# Patient Record
Sex: Female | Born: 1965 | Race: White | Hispanic: No | Smoking: Never smoker
Health system: Southern US, Community
[De-identification: ages and names within clinical notes are randomized; demographics above are authoritative.]

## PROBLEM LIST (undated history)

## (undated) DIAGNOSIS — I10 Essential (primary) hypertension: Secondary | ICD-10-CM

## (undated) DIAGNOSIS — M5430 Sciatica, unspecified side: Secondary | ICD-10-CM

## (undated) DIAGNOSIS — F329 Major depressive disorder, single episode, unspecified: Secondary | ICD-10-CM

## (undated) DIAGNOSIS — K219 Gastro-esophageal reflux disease without esophagitis: Secondary | ICD-10-CM

## (undated) DIAGNOSIS — D649 Anemia, unspecified: Secondary | ICD-10-CM

## (undated) DIAGNOSIS — E119 Type 2 diabetes mellitus without complications: Secondary | ICD-10-CM

## (undated) DIAGNOSIS — K8000 Calculus of gallbladder with acute cholecystitis without obstruction: Principal | ICD-10-CM

## (undated) DIAGNOSIS — N921 Excessive and frequent menstruation with irregular cycle: Secondary | ICD-10-CM

## (undated) DIAGNOSIS — F419 Anxiety disorder, unspecified: Secondary | ICD-10-CM

## (undated) HISTORY — DX: Excessive and frequent menstruation with irregular cycle: N92.1

## (undated) HISTORY — PX: TONSILLECTOMY: SUR1361

## (undated) HISTORY — PX: TUBAL LIGATION: SHX77

---

## 1998-02-13 ENCOUNTER — Other Ambulatory Visit: Admission: RE | Admit: 1998-02-13 | Discharge: 1998-02-13 | Payer: Self-pay | Admitting: *Deleted

## 1998-03-26 ENCOUNTER — Other Ambulatory Visit: Admission: RE | Admit: 1998-03-26 | Discharge: 1998-03-26 | Payer: Self-pay | Admitting: *Deleted

## 1999-05-01 ENCOUNTER — Other Ambulatory Visit: Admission: RE | Admit: 1999-05-01 | Discharge: 1999-05-01 | Payer: Self-pay | Admitting: *Deleted

## 2000-04-25 ENCOUNTER — Other Ambulatory Visit: Admission: RE | Admit: 2000-04-25 | Discharge: 2000-04-25 | Payer: Self-pay | Admitting: *Deleted

## 2000-11-22 ENCOUNTER — Encounter: Payer: Self-pay | Admitting: Unknown Physician Specialty

## 2000-11-22 ENCOUNTER — Ambulatory Visit (HOSPITAL_COMMUNITY): Admission: RE | Admit: 2000-11-22 | Discharge: 2000-11-22 | Payer: Self-pay | Admitting: Unknown Physician Specialty

## 2005-04-15 ENCOUNTER — Encounter (INDEPENDENT_AMBULATORY_CARE_PROVIDER_SITE_OTHER): Payer: Self-pay | Admitting: Internal Medicine

## 2005-04-15 LAB — CONVERTED CEMR LAB: Pap Smear: NORMAL

## 2005-06-02 ENCOUNTER — Encounter (INDEPENDENT_AMBULATORY_CARE_PROVIDER_SITE_OTHER): Payer: Self-pay | Admitting: *Deleted

## 2005-06-02 ENCOUNTER — Ambulatory Visit: Payer: Self-pay | Admitting: Internal Medicine

## 2005-06-02 LAB — CONVERTED CEMR LAB
BUN: 12 mg/dL
Basophils Absolute: 0.1 10*3/uL
Basophils Relative: 1 %
Creatinine, Ser: 0.8 mg/dL
Eosinophils Absolute: 0.3 10*3/uL
Eosinophils Relative: 3 %
HCT: 43.2 %
Hemoglobin: 14.4 g/dL
Lymphocytes Relative: 26 %
Lymphs Abs: 3 10*3/uL
MCHC: 33.3 g/dL
MCV: 90.2 fL
Monocytes Absolute: 0.7 10*3/uL
Monocytes Relative: 6 %
Neutro Abs: 7.3 10*3/uL
Neutrophils Relative %: 64 %
Platelets: 419 10*3/uL
Potassium: 3.9 meq/L
RBC count: 4.79 10*6/uL
RBC: 4.79 M/uL
RDW: 13.5 %
WBC, blood: 11.3 10*3/uL
WBC: 11.3 10*3/uL

## 2005-08-25 ENCOUNTER — Ambulatory Visit: Payer: Self-pay | Admitting: Internal Medicine

## 2005-10-05 ENCOUNTER — Ambulatory Visit: Payer: Self-pay | Admitting: Internal Medicine

## 2005-11-25 ENCOUNTER — Ambulatory Visit (HOSPITAL_COMMUNITY): Payer: Self-pay | Admitting: Psychiatry

## 2005-12-28 ENCOUNTER — Ambulatory Visit (HOSPITAL_COMMUNITY): Payer: Self-pay | Admitting: Psychiatry

## 2006-01-27 ENCOUNTER — Ambulatory Visit (HOSPITAL_COMMUNITY): Payer: Self-pay | Admitting: Psychiatry

## 2006-04-15 ENCOUNTER — Telehealth (INDEPENDENT_AMBULATORY_CARE_PROVIDER_SITE_OTHER): Payer: Self-pay | Admitting: *Deleted

## 2006-04-22 ENCOUNTER — Encounter: Payer: Self-pay | Admitting: Internal Medicine

## 2006-04-22 DIAGNOSIS — G43909 Migraine, unspecified, not intractable, without status migrainosus: Secondary | ICD-10-CM | POA: Insufficient documentation

## 2006-04-22 DIAGNOSIS — F329 Major depressive disorder, single episode, unspecified: Secondary | ICD-10-CM | POA: Insufficient documentation

## 2006-04-22 DIAGNOSIS — F411 Generalized anxiety disorder: Secondary | ICD-10-CM | POA: Insufficient documentation

## 2006-04-22 DIAGNOSIS — F3289 Other specified depressive episodes: Secondary | ICD-10-CM | POA: Insufficient documentation

## 2006-04-22 DIAGNOSIS — K219 Gastro-esophageal reflux disease without esophagitis: Secondary | ICD-10-CM | POA: Insufficient documentation

## 2006-04-25 ENCOUNTER — Ambulatory Visit: Payer: Self-pay | Admitting: Internal Medicine

## 2006-04-25 DIAGNOSIS — R030 Elevated blood-pressure reading, without diagnosis of hypertension: Secondary | ICD-10-CM | POA: Insufficient documentation

## 2006-04-26 LAB — CONVERTED CEMR LAB
BUN: 11 mg/dL (ref 6–23)
CO2: 22 meq/L (ref 19–32)
Calcium: 8.8 mg/dL (ref 8.4–10.5)
Chloride: 104 meq/L (ref 96–112)
Creatinine, Ser: 0.7 mg/dL (ref 0.40–1.20)
Glucose, Bld: 96 mg/dL (ref 70–99)
Potassium: 3.8 meq/L (ref 3.5–5.3)
Sodium: 139 meq/L (ref 135–145)
TSH: 2.151 microintl units/mL (ref 0.350–5.50)

## 2006-04-28 ENCOUNTER — Ambulatory Visit (HOSPITAL_COMMUNITY): Payer: Self-pay | Admitting: Psychiatry

## 2006-06-02 ENCOUNTER — Ambulatory Visit: Payer: Self-pay | Admitting: Internal Medicine

## 2006-06-02 DIAGNOSIS — E8770 Fluid overload, unspecified: Secondary | ICD-10-CM | POA: Insufficient documentation

## 2006-06-02 LAB — CONVERTED CEMR LAB: Pap Smear: NORMAL

## 2006-06-06 ENCOUNTER — Encounter (INDEPENDENT_AMBULATORY_CARE_PROVIDER_SITE_OTHER): Payer: Self-pay | Admitting: Internal Medicine

## 2006-06-09 ENCOUNTER — Encounter (INDEPENDENT_AMBULATORY_CARE_PROVIDER_SITE_OTHER): Payer: Self-pay | Admitting: Internal Medicine

## 2006-07-05 ENCOUNTER — Ambulatory Visit (HOSPITAL_COMMUNITY): Payer: Self-pay | Admitting: Psychiatry

## 2006-10-06 ENCOUNTER — Ambulatory Visit (HOSPITAL_COMMUNITY): Payer: Self-pay | Admitting: Psychiatry

## 2007-02-08 ENCOUNTER — Telehealth (INDEPENDENT_AMBULATORY_CARE_PROVIDER_SITE_OTHER): Payer: Self-pay | Admitting: Internal Medicine

## 2007-03-09 ENCOUNTER — Telehealth (INDEPENDENT_AMBULATORY_CARE_PROVIDER_SITE_OTHER): Payer: Self-pay | Admitting: *Deleted

## 2007-03-10 ENCOUNTER — Ambulatory Visit: Payer: Self-pay | Admitting: Internal Medicine

## 2007-03-13 ENCOUNTER — Telehealth (INDEPENDENT_AMBULATORY_CARE_PROVIDER_SITE_OTHER): Payer: Self-pay | Admitting: *Deleted

## 2007-03-13 LAB — CONVERTED CEMR LAB
BUN: 11 mg/dL (ref 6–23)
CO2: 23 meq/L (ref 19–32)
Calcium: 9.5 mg/dL (ref 8.4–10.5)
Chloride: 99 meq/L (ref 96–112)
Creatinine, Ser: 0.71 mg/dL (ref 0.40–1.20)
Glucose, Bld: 99 mg/dL (ref 70–99)
Potassium: 3.6 meq/L (ref 3.5–5.3)
Sodium: 139 meq/L (ref 135–145)

## 2007-04-07 ENCOUNTER — Telehealth (INDEPENDENT_AMBULATORY_CARE_PROVIDER_SITE_OTHER): Payer: Self-pay | Admitting: Internal Medicine

## 2007-05-31 ENCOUNTER — Telehealth (INDEPENDENT_AMBULATORY_CARE_PROVIDER_SITE_OTHER): Payer: Self-pay | Admitting: Internal Medicine

## 2007-06-07 ENCOUNTER — Ambulatory Visit: Payer: Self-pay | Admitting: Internal Medicine

## 2007-06-07 DIAGNOSIS — I152 Hypertension secondary to endocrine disorders: Secondary | ICD-10-CM | POA: Insufficient documentation

## 2007-06-07 DIAGNOSIS — I10 Essential (primary) hypertension: Secondary | ICD-10-CM

## 2007-06-07 DIAGNOSIS — E1159 Type 2 diabetes mellitus with other circulatory complications: Secondary | ICD-10-CM | POA: Insufficient documentation

## 2007-06-07 LAB — CONVERTED CEMR LAB
Bilirubin Urine: NEGATIVE
Blood Glucose, Fingerstick: 81
Blood in Urine, dipstick: NEGATIVE
Glucose, Urine, Semiquant: NEGATIVE
Hemoglobin: 14.2 g/dL
Ketones, urine, test strip: NEGATIVE
Nitrite: NEGATIVE
Protein, U semiquant: NEGATIVE
Specific Gravity, Urine: <1.005
Urobilinogen, UA: 0.2
WBC Urine, dipstick: NEGATIVE
pH: 5.5

## 2007-06-22 ENCOUNTER — Other Ambulatory Visit: Admission: RE | Admit: 2007-06-22 | Discharge: 2007-06-22 | Payer: Self-pay | Admitting: Internal Medicine

## 2007-06-22 ENCOUNTER — Encounter (INDEPENDENT_AMBULATORY_CARE_PROVIDER_SITE_OTHER): Payer: Self-pay | Admitting: Internal Medicine

## 2007-06-22 ENCOUNTER — Ambulatory Visit: Payer: Self-pay | Admitting: Internal Medicine

## 2007-06-22 LAB — CONVERTED CEMR LAB: OCCULT 1: NEGATIVE

## 2007-06-28 ENCOUNTER — Telehealth (INDEPENDENT_AMBULATORY_CARE_PROVIDER_SITE_OTHER): Payer: Self-pay | Admitting: *Deleted

## 2007-06-28 ENCOUNTER — Encounter (INDEPENDENT_AMBULATORY_CARE_PROVIDER_SITE_OTHER): Payer: Self-pay | Admitting: Internal Medicine

## 2008-02-28 ENCOUNTER — Telehealth (INDEPENDENT_AMBULATORY_CARE_PROVIDER_SITE_OTHER): Payer: Self-pay | Admitting: *Deleted

## 2008-03-14 ENCOUNTER — Encounter (INDEPENDENT_AMBULATORY_CARE_PROVIDER_SITE_OTHER): Payer: Self-pay | Admitting: Internal Medicine

## 2008-04-17 ENCOUNTER — Ambulatory Visit: Payer: Self-pay | Admitting: Internal Medicine

## 2008-04-30 ENCOUNTER — Encounter (INDEPENDENT_AMBULATORY_CARE_PROVIDER_SITE_OTHER): Payer: Self-pay | Admitting: Internal Medicine

## 2008-05-02 LAB — CONVERTED CEMR LAB
Calcium: 9 mg/dL (ref 8.4–10.5)
Cholesterol: 184 mg/dL (ref 0–200)
Creatinine, Ser: 0.66 mg/dL (ref 0.40–1.20)
HDL: 53 mg/dL (ref >39)
Triglycerides: 174 mg/dL — ABNORMAL HIGH (ref <150)

## 2008-05-14 ENCOUNTER — Ambulatory Visit: Payer: Self-pay | Admitting: Internal Medicine

## 2008-05-14 DIAGNOSIS — J069 Acute upper respiratory infection, unspecified: Secondary | ICD-10-CM | POA: Insufficient documentation

## 2008-06-17 ENCOUNTER — Encounter (INDEPENDENT_AMBULATORY_CARE_PROVIDER_SITE_OTHER): Payer: Self-pay | Admitting: Internal Medicine

## 2008-06-17 ENCOUNTER — Ambulatory Visit: Payer: Self-pay | Admitting: Internal Medicine

## 2008-06-17 ENCOUNTER — Other Ambulatory Visit: Admission: RE | Admit: 2008-06-17 | Discharge: 2008-06-17 | Payer: Self-pay | Admitting: Internal Medicine

## 2008-06-17 DIAGNOSIS — E669 Obesity, unspecified: Secondary | ICD-10-CM | POA: Insufficient documentation

## 2008-07-08 ENCOUNTER — Ambulatory Visit: Payer: Self-pay | Admitting: Internal Medicine

## 2008-07-08 DIAGNOSIS — H6690 Otitis media, unspecified, unspecified ear: Secondary | ICD-10-CM | POA: Insufficient documentation

## 2008-07-08 DIAGNOSIS — L02519 Cutaneous abscess of unspecified hand: Secondary | ICD-10-CM | POA: Insufficient documentation

## 2008-07-08 DIAGNOSIS — L03019 Cellulitis of unspecified finger: Secondary | ICD-10-CM

## 2008-07-09 ENCOUNTER — Encounter (INDEPENDENT_AMBULATORY_CARE_PROVIDER_SITE_OTHER): Payer: Self-pay | Admitting: Internal Medicine

## 2008-07-24 ENCOUNTER — Ambulatory Visit: Payer: Self-pay | Admitting: Internal Medicine

## 2008-07-24 DIAGNOSIS — M545 Low back pain, unspecified: Secondary | ICD-10-CM | POA: Insufficient documentation

## 2008-08-12 ENCOUNTER — Encounter (INDEPENDENT_AMBULATORY_CARE_PROVIDER_SITE_OTHER): Payer: Self-pay | Admitting: Internal Medicine

## 2009-01-13 ENCOUNTER — Emergency Department (HOSPITAL_COMMUNITY): Admission: EM | Admit: 2009-01-13 | Discharge: 2009-01-13 | Payer: Self-pay | Admitting: Emergency Medicine

## 2010-02-01 ENCOUNTER — Emergency Department (HOSPITAL_COMMUNITY)
Admission: EM | Admit: 2010-02-01 | Discharge: 2010-02-01 | Payer: Self-pay | Source: Home / Self Care | Admitting: Emergency Medicine

## 2010-09-17 ENCOUNTER — Emergency Department (HOSPITAL_COMMUNITY)
Admission: EM | Admit: 2010-09-17 | Discharge: 2010-09-17 | Disposition: A | Discharge status: Home / Self Care | Payer: Self-pay | Attending: Emergency Medicine | Admitting: Emergency Medicine

## 2010-09-17 ENCOUNTER — Encounter: Payer: Self-pay | Admitting: *Deleted

## 2010-09-17 DIAGNOSIS — I10 Essential (primary) hypertension: Secondary | ICD-10-CM | POA: Insufficient documentation

## 2010-09-17 DIAGNOSIS — M2559 Pain in other specified joint: Secondary | ICD-10-CM | POA: Insufficient documentation

## 2010-09-17 DIAGNOSIS — M2550 Pain in unspecified joint: Secondary | ICD-10-CM

## 2010-09-17 DIAGNOSIS — G479 Sleep disorder, unspecified: Secondary | ICD-10-CM | POA: Insufficient documentation

## 2010-09-17 DIAGNOSIS — R5383 Other fatigue: Secondary | ICD-10-CM | POA: Insufficient documentation

## 2010-09-17 DIAGNOSIS — R5381 Other malaise: Secondary | ICD-10-CM | POA: Insufficient documentation

## 2010-09-17 DIAGNOSIS — R350 Frequency of micturition: Secondary | ICD-10-CM | POA: Insufficient documentation

## 2010-09-17 HISTORY — DX: Major depressive disorder, single episode, unspecified: F32.9

## 2010-09-17 HISTORY — DX: Essential (primary) hypertension: I10

## 2010-09-17 LAB — POCT PREGNANCY, URINE: Preg Test, Ur: NEGATIVE

## 2010-09-17 LAB — DIFFERENTIAL
Lymphocytes Relative: 28 % (ref 12–46)
Lymphs Abs: 2.2 10*3/uL (ref 0.7–4.0)
Monocytes Absolute: 0.5 10*3/uL (ref 0.1–1.0)
Monocytes Relative: 7 % (ref 3–12)
Neutro Abs: 4.7 10*3/uL (ref 1.7–7.7)

## 2010-09-17 LAB — COMPREHENSIVE METABOLIC PANEL
Alkaline Phosphatase: 73 U/L (ref 39–117)
BUN: 8 mg/dL (ref 6–23)
CO2: 28 mEq/L (ref 19–32)
Chloride: 96 mEq/L (ref 96–112)
Creatinine, Ser: 0.67 mg/dL (ref 0.50–1.10)
GFR calc non Af Amer: >60 mL/min (ref >60)
Total Bilirubin: 0.3 mg/dL (ref 0.3–1.2)

## 2010-09-17 LAB — URINALYSIS, ROUTINE W REFLEX MICROSCOPIC
Hgb urine dipstick: NEGATIVE
Ketones, ur: NEGATIVE mg/dL
Leukocytes, UA: NEGATIVE
Protein, ur: NEGATIVE mg/dL
Urobilinogen, UA: 0.2 mg/dL (ref 0.0–1.0)

## 2010-09-17 LAB — CBC
HCT: 41 % (ref 36.0–46.0)
Hemoglobin: 13.7 g/dL (ref 12.0–15.0)
RBC: 4.72 MIL/uL (ref 3.87–5.11)
WBC: 7.6 10*3/uL (ref 4.0–10.5)

## 2010-09-17 MED ORDER — OXYCODONE-ACETAMINOPHEN 5-325 MG PO TABS
1.0000 | ORAL_TABLET | Freq: Once | ORAL | Status: AC
  Administered 2010-09-17: 1 via ORAL
  Filled 2010-09-17: qty 1

## 2010-09-17 NOTE — ED Notes (Signed)
Pt reports lower back pain for the past week.  Pt denies any injury to her back but states "i just hurt and feel weak all the time".  Pt also reports some rt shoulder pain.  Pt states that she looked in the mirror today and thought that she looked yellow.  nad noted

## 2010-09-17 NOTE — ED Provider Notes (Signed)
History     CSN: [id number] Arrival date & time: 09/17/2010  5:44 PM  Chief Complaint  Patient presents with  . Back Pain   Patient is a 45 y.o. female presenting with back pain. The history is provided by the patient.  Back Pain  The current episode started more than 2 days ago (5 days ago). The problem occurs constantly. The pain is associated with no known injury. The pain is present in the gluteal region and lumbar spine. The quality of the pain is described as aching. The pain does not radiate. The pain is at a severity of 7/10. The pain is moderate. Exacerbated by: nothing. Pertinent negatives include no chest pain, no fever, no numbness, no headaches, no abdominal pain, no tingling and no weakness. Associated symptoms comments: Has noted increased urinary frequency,  But just at night.  Also,  Has had insomnia,  And today thought she looked jaundiced.  She denies abdominal pain.  She has had a lingering,  Spotting type vaginal bleeding since her period began 09/08/10.  Denies pelvic pain or cramping.  She has been fatigued.  She started taking trazodone 100 mg daily 2 days prior to the symptom onset..    Past Medical History  Diagnosis Date  . Hypertension   . Depression     Past Surgical History  Procedure Date  . Tonsillectomy   . Cesarean section     x 2    History reviewed. No pertinent family history.  History  Substance Use Topics  . Smoking status: Never Smoker   . Smokeless tobacco: Not on file  . Alcohol Use: No    OB History    Grav Para Term Preterm Abortions TAB SAB Ect Mult Living                  Review of Systems  Constitutional: Positive for fatigue. Negative for fever and appetite change.  HENT: Negative for congestion, sore throat, facial swelling, rhinorrhea and neck pain.   Eyes: Negative.   Respiratory: Negative for cough, chest tightness and shortness of breath.   Cardiovascular: Negative for chest pain, palpitations and leg swelling.    Gastrointestinal: Negative for nausea, abdominal pain and abdominal distention.  Genitourinary: Positive for urgency.  Musculoskeletal: Positive for back pain. Negative for joint swelling and arthralgias.  Skin: Negative.  Negative for rash and wound.  Neurological: Negative for dizziness, tingling, weakness, light-headedness, numbness and headaches.  Hematological: Negative.   Psychiatric/Behavioral: Positive for sleep disturbance.    Physical Exam  BP 124/75  Pulse 73  Temp(Src) 98.6 F (37 C) (Oral)  Resp 20  Ht 5\' 3"  (1.6 m)  Wt 203 lb (92.08 kg)  BMI 35.96 kg/m2  SpO2 98%  LMP 09/08/2010  Physical Exam  Vitals reviewed. Constitutional: She is oriented to person, place, and time. She appears well-developed and well-nourished. No distress.  HENT:  Head: Normocephalic and atraumatic.  Eyes: Conjunctivae are normal.  Neck: Normal range of motion. Neck supple.  Cardiovascular: Normal rate, regular rhythm, normal heart sounds and intact distal pulses.   Pulmonary/Chest: Effort normal and breath sounds normal. No respiratory distress. She has no wheezes.  Abdominal: Soft. Bowel sounds are normal. She exhibits no mass. There is no tenderness.  Musculoskeletal: Normal range of motion.       Lumbar back: She exhibits tenderness and pain. She exhibits no edema, no deformity and normal pulse.       Paralumbar ttp,  Left with radiation to left buttock  Lymphadenopathy:    She has no cervical adenopathy.  Neurological: She is alert and oriented to person, place, and time. She has normal strength. No sensory deficit. Gait normal.  Reflex Scores:      Patellar reflexes are 2+ on the right side and 2+ on the left side.      Achilles reflexes are 2+ on the right side and 2+ on the left side.      No decreased strength in major muscle groups of lower extremities.  Skin: Skin is warm and dry.  Psychiatric: She has a normal mood and affect.    ED Course  Procedures  MDM    Discussed results of labs tonight.  Discussed possible cause of sx side effect of her new medicine trazodone.      Candis Musa, PA 09/17/10 2034  Addendum to hpi - reports generalized weakness and fatigue.   Candis Musa, PA 09/17/10 2036

## 2010-09-17 NOTE — ED Notes (Signed)
Pt c/o lower back pain x 5 days. Pt states that she is urinating more at night. Denies pain and burning. Also c/o pain in her rt shoulder that has been going on for a while. States that she started her period on 09/08/10 and it is still on. Pt also states that she looks yellow when she looked in the mirror.

## 2010-09-19 NOTE — ED Provider Notes (Signed)
Medical screening examination/treatment/procedure(s) were performed by non-physician practitioner and as supervising physician I was immediately available for consultation/collaboration.  Rohnan Bartleson W Eon Zunker, MD 09/19/10 1613 

## 2011-01-30 ENCOUNTER — Emergency Department (HOSPITAL_COMMUNITY)
Admission: EM | Admit: 2011-01-30 | Discharge: 2011-01-30 | Disposition: A | Discharge status: Home / Self Care | Payer: Self-pay | Attending: Emergency Medicine | Admitting: Emergency Medicine

## 2011-01-30 ENCOUNTER — Encounter (HOSPITAL_COMMUNITY): Payer: Self-pay | Admitting: *Deleted

## 2011-01-30 DIAGNOSIS — I10 Essential (primary) hypertension: Secondary | ICD-10-CM | POA: Insufficient documentation

## 2011-01-30 DIAGNOSIS — M5430 Sciatica, unspecified side: Secondary | ICD-10-CM | POA: Insufficient documentation

## 2011-01-30 DIAGNOSIS — F329 Major depressive disorder, single episode, unspecified: Secondary | ICD-10-CM | POA: Insufficient documentation

## 2011-01-30 DIAGNOSIS — F3289 Other specified depressive episodes: Secondary | ICD-10-CM | POA: Insufficient documentation

## 2011-01-30 HISTORY — DX: Sciatica, unspecified side: M54.30

## 2011-01-30 LAB — URINALYSIS, ROUTINE W REFLEX MICROSCOPIC
Bilirubin Urine: NEGATIVE
Glucose, UA: NEGATIVE mg/dL
Ketones, ur: NEGATIVE mg/dL
Leukocytes, UA: NEGATIVE
Nitrite: NEGATIVE
Protein, ur: NEGATIVE mg/dL
Specific Gravity, Urine: 1.01 (ref 1.005–1.030)
Urobilinogen, UA: 0.2 mg/dL (ref 0.0–1.0)
pH: 6 (ref 5.0–8.0)

## 2011-01-30 LAB — URINE MICROSCOPIC-ADD ON

## 2011-01-30 MED ORDER — KETOROLAC TROMETHAMINE 60 MG/2ML IM SOLN
60.0000 mg | Freq: Once | INTRAMUSCULAR | Status: AC
  Administered 2011-01-30: 60 mg via INTRAMUSCULAR
  Filled 2011-01-30: qty 2

## 2011-01-30 MED ORDER — CYCLOBENZAPRINE HCL 5 MG PO TABS: 10.0000 mg | ORAL_TABLET | Freq: Three times a day (TID) | ORAL | Status: AC | PRN

## 2011-01-30 MED ORDER — CYCLOBENZAPRINE HCL 10 MG PO TABS
10.0000 mg | ORAL_TABLET | Freq: Once | ORAL | Status: AC
  Administered 2011-01-30: 10 mg via ORAL
  Filled 2011-01-30: qty 1

## 2011-01-30 MED ORDER — HYDROCODONE-ACETAMINOPHEN 5-325 MG PO TABS
1.0000 | ORAL_TABLET | Freq: Once | ORAL | Status: AC
  Administered 2011-01-30: 1 via ORAL
  Filled 2011-01-30: qty 1

## 2011-01-30 MED ORDER — IBUPROFEN 600 MG PO TABS: 600.0000 mg | ORAL_TABLET | Freq: Four times a day (QID) | ORAL | Status: AC | PRN

## 2011-01-30 MED ORDER — HYDROCODONE-ACETAMINOPHEN 5-325 MG PO TABS: 1.0000 | ORAL_TABLET | Freq: Once | ORAL | Status: AC

## 2011-01-30 NOTE — ED Notes (Addendum)
Pt c/o lower back pain radiating down both legs x 2 days. Denies urinary symptoms. Hx of sciatica

## 2011-01-30 NOTE — ED Provider Notes (Signed)
History/physical exam/procedure(s) were performed by non-physician practitioner and as supervising physician I was immediately available for consultation/collaboration. I have reviewed all notes and am in agreement with care and plan.   Hilario Quarry, MD 01/30/11 2159

## 2011-01-30 NOTE — ED Provider Notes (Signed)
History     CSN: [id number] Arrival date & time: 01/30/2011  8:10 PM   First MD Initiated Contact with Patient 01/30/11 2022      Chief Complaint  Patient presents with  . Back Pain    (Consider location/radiation/quality/duration/timing/severity/associated sxs/prior treatment) Patient is a 45 y.o. female presenting with back pain. The history is provided by the patient.  Back Pain  This is a recurrent problem. The current episode started 2 days ago. The problem occurs constantly. The problem has not changed since onset.Associated with: Reports old injury from slip on ice several years ago.  MRI at that time revealed degenerative changes in her lower back. The pain is present in the lumbar spine. The quality of the pain is described as stabbing and shooting. The pain radiates to the left knee and right knee. The pain is at a severity of 10/10. The pain is severe. The symptoms are aggravated by certain positions, twisting and bending. The pain is worse during the day. Stiffness is present in the morning. Associated symptoms include leg pain. Pertinent negatives include no chest pain, no fever, no numbness, no headaches, no abdominal pain, no bowel incontinence, no perianal numbness, no bladder incontinence, no dysuria, no paresthesias, no paresis, no tingling and no weakness.    Past Medical History  Diagnosis Date  . Hypertension   . Depression   . Sciatica     Past Surgical History  Procedure Date  . Tonsillectomy   . Cesarean section     x 2    No family history on file.  History  Substance Use Topics  . Smoking status: Never Smoker   . Smokeless tobacco: Not on file  . Alcohol Use: No    OB History    Grav Para Term Preterm Abortions TAB SAB Ect Mult Living                  Review of Systems  Constitutional: Negative for fever.  HENT: Negative for congestion, sore throat and neck pain.   Eyes: Negative.   Respiratory: Negative for chest tightness and shortness  of breath.   Cardiovascular: Negative for chest pain.  Gastrointestinal: Negative for nausea, abdominal pain and bowel incontinence.  Genitourinary: Negative.  Negative for bladder incontinence, dysuria and urgency.  Musculoskeletal: Positive for back pain. Negative for joint swelling and arthralgias.  Skin: Negative.  Negative for rash and wound.  Neurological: Negative for dizziness, tingling, weakness, light-headedness, numbness, headaches and paresthesias.  Hematological: Negative.   Psychiatric/Behavioral: Negative.     Allergies  Bupropion hcl; Squid oil; and St johns wort  Home Medications   Current Outpatient Rx  Name Route Sig Dispense Refill  . FLUOXETINE HCL 40 MG PO CAPS Oral Take 40 mg by mouth daily.      . TRIAMTERENE-HCTZ 37.5-25 MG PO TABS Oral Take 1 tablet by mouth daily.        BP 130/62  Pulse 80  Temp 98.6 F (37 C)  Resp 20  Ht 5\' 3"  (1.6 m)  Wt 212 lb (96.163 kg)  BMI 37.55 kg/m2  SpO2 97%  LMP 01/24/2011  Physical Exam  Nursing note and vitals reviewed. Constitutional: She is oriented to person, place, and time. She appears well-developed and well-nourished.  HENT:  Head: Normocephalic.  Eyes: Conjunctivae are normal.  Neck: Normal range of motion. Neck supple.  Cardiovascular: Regular rhythm and intact distal pulses.        Pedal pulses normal.  Pulmonary/Chest: Effort normal. She  has no wheezes.  Abdominal: Soft. Bowel sounds are normal. She exhibits no distension and no mass.  Musculoskeletal: Normal range of motion. She exhibits no edema.       Lumbar back: She exhibits tenderness. She exhibits no swelling, no edema and no spasm.  Neurological: She is alert and oriented to person, place, and time. She has normal strength. She displays no atrophy and no tremor. No cranial nerve deficit or sensory deficit. Gait normal.  Reflex Scores:      Patellar reflexes are 1+ on the right side and 1+ on the left side.      Achilles reflexes are 2+ on  the right side and 2+ on the left side.      No strength deficit noted in hip and knee flexor and extensor muscle groups.  Ankle flexion and extension intact.  Skin: Skin is warm and dry.  Psychiatric: She has a normal mood and affect.    ED Course  Procedures (including critical care time)  Labs Reviewed  URINALYSIS, ROUTINE W REFLEX MICROSCOPIC - Abnormal; Notable for the following:    Hgb urine dipstick LARGE (*)    All other components within normal limits  URINE MICROSCOPIC-ADD ON - Abnormal; Notable for the following:    Squamous Epithelial / LPF FEW (*)    All other components within normal limits  PREGNANCY, URINE   No results found.  Patient currently on menses,  This was a clean catch specimen.  No sx to suggest kidney stone.   No diagnosis found.    MDM  Chronic intermittent low back pain/sciatica with no significant neuro deficit on exam or by history.   Hydrocodone,  Ibuprofen,  Flexeril.  Referral to ortho prn if sx worsen.        Candis Musa, PA 01/30/11 2139  Candis Musa, PA 01/30/11 [phone]

## 2011-06-01 ENCOUNTER — Encounter (HOSPITAL_COMMUNITY): Payer: Self-pay

## 2011-06-01 ENCOUNTER — Emergency Department (HOSPITAL_COMMUNITY)
Admission: EM | Admit: 2011-06-01 | Discharge: 2011-06-01 | Disposition: A | Discharge status: Home / Self Care | Payer: No Typology Code available for payment source | Attending: Emergency Medicine | Admitting: Emergency Medicine

## 2011-06-01 DIAGNOSIS — F329 Major depressive disorder, single episode, unspecified: Secondary | ICD-10-CM | POA: Insufficient documentation

## 2011-06-01 DIAGNOSIS — M545 Low back pain, unspecified: Secondary | ICD-10-CM | POA: Insufficient documentation

## 2011-06-01 DIAGNOSIS — Y9289 Other specified places as the place of occurrence of the external cause: Secondary | ICD-10-CM | POA: Insufficient documentation

## 2011-06-01 DIAGNOSIS — I10 Essential (primary) hypertension: Secondary | ICD-10-CM | POA: Insufficient documentation

## 2011-06-01 DIAGNOSIS — F3289 Other specified depressive episodes: Secondary | ICD-10-CM | POA: Insufficient documentation

## 2011-06-01 DIAGNOSIS — M5430 Sciatica, unspecified side: Secondary | ICD-10-CM | POA: Insufficient documentation

## 2011-06-01 DIAGNOSIS — R52 Pain, unspecified: Secondary | ICD-10-CM | POA: Insufficient documentation

## 2011-06-01 MED ORDER — CYCLOBENZAPRINE HCL 10 MG PO TABS: 10.0000 mg | ORAL_TABLET | Freq: Three times a day (TID) | ORAL | Status: AC | PRN
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
## 2011-06-01 NOTE — Discharge Instructions (Signed)
° °

## 2011-06-03 NOTE — ED Provider Notes (Signed)
Medical screening examination/treatment/procedure(s) were performed by non-physician practitioner and as supervising physician I was immediately available for consultation/collaboration.   Carleene Cooper III, MD 06/03/11 1210

## 2011-09-24 ENCOUNTER — Encounter (HOSPITAL_COMMUNITY): Payer: Self-pay | Admitting: *Deleted

## 2011-09-24 ENCOUNTER — Emergency Department (HOSPITAL_COMMUNITY)
Admission: EM | Admit: 2011-09-24 | Discharge: 2011-09-24 | Disposition: A | Discharge status: Home / Self Care | Payer: Self-pay | Attending: Emergency Medicine | Admitting: Emergency Medicine

## 2011-09-24 ENCOUNTER — Emergency Department (HOSPITAL_COMMUNITY): Payer: Self-pay

## 2011-09-24 DIAGNOSIS — M545 Low back pain, unspecified: Secondary | ICD-10-CM | POA: Insufficient documentation

## 2011-09-24 DIAGNOSIS — I10 Essential (primary) hypertension: Secondary | ICD-10-CM | POA: Insufficient documentation

## 2011-09-24 DIAGNOSIS — F3289 Other specified depressive episodes: Secondary | ICD-10-CM | POA: Insufficient documentation

## 2011-09-24 DIAGNOSIS — W010XXA Fall on same level from slipping, tripping and stumbling without subsequent striking against object, initial encounter: Secondary | ICD-10-CM | POA: Insufficient documentation

## 2011-09-24 DIAGNOSIS — S20229A Contusion of unspecified back wall of thorax, initial encounter: Secondary | ICD-10-CM | POA: Insufficient documentation

## 2011-09-24 DIAGNOSIS — M79609 Pain in unspecified limb: Secondary | ICD-10-CM | POA: Insufficient documentation

## 2011-09-24 DIAGNOSIS — M1990 Unspecified osteoarthritis, unspecified site: Secondary | ICD-10-CM | POA: Insufficient documentation

## 2011-09-24 DIAGNOSIS — S63509A Unspecified sprain of unspecified wrist, initial encounter: Secondary | ICD-10-CM | POA: Insufficient documentation

## 2011-09-24 DIAGNOSIS — F329 Major depressive disorder, single episode, unspecified: Secondary | ICD-10-CM | POA: Insufficient documentation

## 2011-09-24 DIAGNOSIS — Z79899 Other long term (current) drug therapy: Secondary | ICD-10-CM | POA: Insufficient documentation

## 2011-09-24 MED ORDER — KETOROLAC TROMETHAMINE 10 MG PO TABS
10.0000 mg | ORAL_TABLET | Freq: Once | ORAL | Status: AC
  Administered 2011-09-24: 10 mg via ORAL
  Filled 2011-09-24: qty 1

## 2011-09-24 MED ORDER — METHOCARBAMOL 500 MG PO TABS: ORAL_TABLET | ORAL | Status: DC

## 2011-09-24 MED ORDER — HYDROCODONE-ACETAMINOPHEN 5-325 MG PO TABS
2.0000 | ORAL_TABLET | Freq: Once | ORAL | Status: AC
  Administered 2011-09-24: 2 via ORAL
  Filled 2011-09-24: qty 2

## 2011-09-24 MED ORDER — HYDROCODONE-ACETAMINOPHEN 5-325 MG PO TABS: 1.0000 | ORAL_TABLET | ORAL | Status: AC | PRN

## 2011-09-24 MED ORDER — MELOXICAM 7.5 MG PO TABS: ORAL_TABLET | ORAL | Status: DC

## 2011-09-24 MED ORDER — ONDANSETRON HCL 4 MG PO TABS
4.0000 mg | ORAL_TABLET | Freq: Once | ORAL | Status: AC
  Administered 2011-09-24: 4 mg via ORAL
  Filled 2011-09-24: qty 1

## 2011-09-24 MED ORDER — DIAZEPAM 5 MG PO TABS
5.0000 mg | ORAL_TABLET | Freq: Once | ORAL | Status: AC
  Administered 2011-09-24: 5 mg via ORAL
  Filled 2011-09-24: qty 1

## 2011-09-24 NOTE — ED Notes (Signed)
Fell on wet floor while mopping, pain rt hand and rt back and leg.

## 2011-09-24 NOTE — ED Notes (Signed)
H. Bryant, PA at bedside. 

## 2011-09-24 NOTE — ED Provider Notes (Signed)
History     CSN: [id number]  Arrival date & time 09/24/11  2952   First MD Initiated Contact with Patient 09/24/11 2059      Chief Complaint  Patient presents with  . Fall    (Consider location/radiation/quality/duration/timing/severity/associated sxs/prior treatment) HPI Comments: Patient states she fell on a wet floor while mopping. She complains of pain to the right hand and to the lower back and leg, right more than left. The patient denies being on any blood thinning type medications or having any bleeding disorders. It is of note that she has sciatica, but she has not had any surgical intervention of her back or her arm.  Patient is a 46 y.o. female presenting with fall. The history is provided by the patient.  Fall Pertinent negatives include no abdominal pain and no hematuria.    Past Medical History  Diagnosis Date  . Hypertension   . Depression   . Sciatica     Past Surgical History  Procedure Date  . Tonsillectomy   . Cesarean section     x 2  . Tubal ligation     History reviewed. No pertinent family history.  History  Substance Use Topics  . Smoking status: Never Smoker   . Smokeless tobacco: Not on file  . Alcohol Use: No    OB History    Grav Para Term Preterm Abortions TAB SAB Ect Mult Living                  Review of Systems  Constitutional: Negative for activity change.       All ROS Neg except as noted in HPI  HENT: Negative for nosebleeds and neck pain.   Eyes: Negative for photophobia and discharge.  Respiratory: Negative for cough, shortness of breath and wheezing.   Cardiovascular: Negative for chest pain and palpitations.  Gastrointestinal: Negative for abdominal pain and blood in stool.  Genitourinary: Negative for dysuria, frequency and hematuria.  Musculoskeletal: Positive for back pain. Negative for arthralgias.  Skin: Negative.   Neurological: Negative for dizziness, seizures and speech difficulty.  Psychiatric/Behavioral:  Negative for hallucinations and confusion.       Depression    Allergies  Bupropion hcl; Squid oil; and St johns wort  Home Medications   Current Outpatient Rx  Name Route Sig Dispense Refill  . FLUOXETINE HCL 40 MG PO CAPS Oral Take 40 mg by mouth daily.      . TRIAMTERENE-HCTZ 37.5-25 MG PO TABS Oral Take 1 tablet by mouth daily.      Marland Kitchen HYDROCODONE-ACETAMINOPHEN 5-325 MG PO TABS Oral Take 1 tablet by mouth every 4 (four) hours as needed for pain. 15 tablet 0  . MELOXICAM 7.5 MG PO TABS  1 po bid with food 12 tablet 0  . METHOCARBAMOL 500 MG PO TABS  2 po tid for spasm 30 tablet 0    BP 134/86  Pulse 107  Temp 98.7 F (37.1 C) (Oral)  Resp 20  Ht 5\' 3"  (1.6 m)  Wt 210 lb (95.255 kg)  BMI 37.20 kg/m2  SpO2 97%  LMP 09/18/2011  Physical Exam  Nursing note and vitals reviewed. Constitutional: She is oriented to person, place, and time. She appears well-developed and well-nourished.  Non-toxic appearance.  HENT:  Head: Normocephalic.  Right Ear: Tympanic membrane and external ear normal.  Left Ear: Tympanic membrane and external ear normal.  Eyes: EOM and lids are normal. Pupils are equal, round, and reactive to light.  Neck: Normal  range of motion. Neck supple. Carotid bruit is not present.  Cardiovascular: Normal rate, regular rhythm, normal heart sounds, intact distal pulses and normal pulses.   Pulmonary/Chest: Breath sounds normal. No respiratory distress.  Abdominal: Soft. Bowel sounds are normal. There is no tenderness. There is no guarding.  Musculoskeletal: Normal range of motion.       There is fair range of motion of the fingers of the right hand. There is good capillary refill. There is mild degenerative changes of the right and left hands. There is pain with attempted range of motion of the wrist. There is no palpable deformity of the wrist. The radial pulses are symmetrical. There is full range of motion of the right elbow and right shoulder. There is soreness  with range of motion of the right shoulder.  There is pain to palpation in change of position involving the lower lumbar area. There is full range of motion of both lower extremities. There is good capillary refill of the lower extremities.  Lymphadenopathy:       Head (right side): No submandibular adenopathy present.       Head (left side): No submandibular adenopathy present.    She has no cervical adenopathy.  Neurological: She is alert and oriented to person, place, and time. She has normal strength. No cranial nerve deficit or sensory deficit.  Skin: Skin is warm and dry.  Psychiatric: She has a normal mood and affect. Her speech is normal.    ED Course  Procedures (including critical care time)  Labs Reviewed - No data to display Dg Lumbar Spine Complete  09/24/2011  *RADIOLOGY REPORT*  Clinical Data: Fall, low back pain  LUMBAR SPINE - COMPLETE 4+ VIEW  Comparison: None.  Findings: Five lumbar-type vertebral bodies.  Normal lumbar lordosis.  No evidence of fracture or dislocation.  The body heights are maintained.  Mild multilevel degenerative changes.  Mild superior endplate changes at T9 and T10, likely chronic.  Vascular calcifications.  IMPRESSION: No fracture or dislocation is seen.  Mild multilevel degenerative changes.  Original Report Authenticated By: Charline Bills, M.D.   Dg Hand Complete Right  09/24/2011  *RADIOLOGY REPORT*  Clinical Data: Fall, pain to the distal second through fourth metacarpals  RIGHT HAND - COMPLETE 3+ VIEW  Comparison: None.  Findings: No fracture or dislocation is seen.  Mild degenerative changes at the first MCP joint.  Visualized soft tissues are grossly unremarkable.  IMPRESSION: No fracture or dislocation is seen.  Original Report Authenticated By: Charline Bills, M.D.     1. Wrist sprain   2. Contusion, back   3. DJD (degenerative joint disease)       MDM  The xrays and vital signs have been reviewed. Findings and plan discussed with  the patient. The x-ray of the lumbar spine shows multilevel degenerative changes but no fracture or dislocation. The x-ray of the right hand and wrist show mild degenerative changes particularly at the first MCP joint, but no fracture or dislocation. The patient is treated with Mobic 7.5 mg, Robaxin 500 mg, and Norco 5 mg #15. Patient is to see the orthopedic physician if not improving.       Kathie Dike, [place] 09/24/11 2128

## 2011-09-24 NOTE — ED Notes (Signed)
Pt fell today and now has pain to lower back and to right hand, bruising noted to right upper arm and swelling noted to right hand

## 2011-09-24 NOTE — ED Provider Notes (Signed)
Medical screening examination/treatment/procedure(s) were performed by non-physician practitioner and as supervising physician I was immediately available for consultation/collaboration.  Devoria Albe, MD, Armando Gang   Ward Givens, MD 09/24/11 2129

## 2011-10-08 ENCOUNTER — Emergency Department (HOSPITAL_COMMUNITY)
Admission: EM | Admit: 2011-10-08 | Discharge: 2011-10-08 | Disposition: A | Discharge status: Home / Self Care | Payer: Self-pay | Attending: Emergency Medicine | Admitting: Emergency Medicine

## 2011-10-08 ENCOUNTER — Encounter (HOSPITAL_COMMUNITY): Payer: Self-pay | Admitting: *Deleted

## 2011-10-08 DIAGNOSIS — M79641 Pain in right hand: Secondary | ICD-10-CM

## 2011-10-08 DIAGNOSIS — M79609 Pain in unspecified limb: Secondary | ICD-10-CM | POA: Insufficient documentation

## 2011-10-08 NOTE — ED Notes (Signed)
Rt hand injury at work 8/9 , here for recheck and return to work note.

## 2011-10-08 NOTE — ED Provider Notes (Signed)
History     CSN: [id number]  Arrival date & time 10/08/11  1739   First MD Initiated Contact with Patient 10/08/11 1819      Chief Complaint  Patient presents with  . Hand Pain    (Consider location/radiation/quality/duration/timing/severity/associated sxs/prior treatment) HPI Comments: Pt sustained injury to the right hand on 8/9. She was evaluated and xrays negative. She was advised to see orthopedics but instead attempted to work with the injured hand. When this would not work she notified her supervisor, she was told slhe would need a note to return to work duty. She states her hand is better now, with only occasional soreness and pain. She denies problem with grip or lifting. No numbness or other problems. She request a note for work because she can not afford to see the orthopedic MD at this time.  Patient is a 46 y.o. female presenting with hand pain. The history is provided by the patient.  Hand Pain Pertinent negatives include no abdominal pain, arthralgias, chest pain, coughing or neck pain.    Past Medical History  Diagnosis Date  . Hypertension   . Depression   . Sciatica     Past Surgical History  Procedure Date  . Tonsillectomy   . Cesarean section     x 2  . Tubal ligation     History reviewed. No pertinent family history.  History  Substance Use Topics  . Smoking status: Never Smoker   . Smokeless tobacco: Not on file  . Alcohol Use: No    OB History    Grav Para Term Preterm Abortions TAB SAB Ect Mult Living                  Review of Systems  Constitutional: Negative for activity change.       All ROS Neg except as noted in HPI  HENT: Negative for nosebleeds and neck pain.   Eyes: Negative for photophobia and discharge.  Respiratory: Negative for cough, shortness of breath and wheezing.   Cardiovascular: Negative for chest pain and palpitations.  Gastrointestinal: Negative for abdominal pain and blood in stool.  Genitourinary: Negative for  dysuria, frequency and hematuria.  Musculoskeletal: Positive for back pain. Negative for arthralgias.  Skin: Negative.   Neurological: Negative for dizziness, seizures and speech difficulty.  Psychiatric/Behavioral: Negative for hallucinations and confusion.       Depression    Allergies  Bupropion hcl; Squid oil; and St johns wort  Home Medications   Current Outpatient Rx  Name Route Sig Dispense Refill  . FLUOXETINE HCL 40 MG PO CAPS Oral Take 40 mg by mouth at bedtime.     Marland Kitchen GABAPENTIN 300 MG PO CAPS Oral Take 300 mg by mouth 2 (two) times daily.    Marland Kitchen METHOCARBAMOL 500 MG PO TABS Oral Take 500 mg by mouth daily as needed. for spasm    . TRIAMTERENE-HCTZ 37.5-25 MG PO TABS Oral Take 1 tablet by mouth daily.      . MELOXICAM 7.5 MG PO TABS Oral Take 7.5 mg by mouth 2 (two) times daily.      BP 139/75  Pulse 85  Temp 98.5 F (36.9 C) (Oral)  Resp 18  Ht 5\' 3"  (1.6 m)  Wt 210 lb (95.255 kg)  BMI 37.20 kg/m2  SpO2 98%  LMP 09/18/2011  Physical Exam  Nursing note and vitals reviewed. Constitutional: She is oriented to person, place, and time. She appears well-developed and well-nourished.  Non-toxic appearance.  HENT:  Head: Normocephalic.  Right Ear: Tympanic membrane and external ear normal.  Left Ear: Tympanic membrane and external ear normal.  Eyes: EOM and lids are normal. Pupils are equal, round, and reactive to light.  Neck: Normal range of motion. Neck supple. Carotid bruit is not present.  Cardiovascular: Normal rate, regular rhythm, normal heart sounds, intact distal pulses and normal pulses.   Pulmonary/Chest: Breath sounds normal. No respiratory distress.  Abdominal: Soft. Bowel sounds are normal. There is no tenderness. There is no guarding.  Musculoskeletal:       FROM of the right shoulder and elbow. FROM of the right wrist. No temp changes. Good cap refill. Radial pulses symmetrical. Good ROM of the fingers with mild soreness of the fingers, extending to the  forearm. No abnormality of the thenar areas. Good 2 point discrimination.  Lymphadenopathy:       Head (right side): No submandibular adenopathy present.       Head (left side): No submandibular adenopathy present.    She has no cervical adenopathy.  Neurological: She is alert and oriented to person, place, and time. She has normal strength. No cranial nerve deficit or sensory deficit.  Skin: Skin is warm and dry.  Psychiatric: She has a normal mood and affect. Her speech is normal.    ED Course  Procedures (including critical care time)  Labs Reviewed - No data to display No results found.   No diagnosis found.    MDM  I have reviewed nursing notes, vital signs, and all appropriate lab and imaging results for this patient. Xray of the right hand reviewed from previous visit. Neurovascularly intact. No gross motor deficit. It is safe for patient to return to work. She is encouraged to see the orthopedic MD as suggested for additional evaluation. She agrees with the plan.       Kathie Dike, [place] 10/23/11 1450

## 2011-10-28 NOTE — ED Provider Notes (Signed)
Medical screening examination/treatment/procedure(s) were performed by non-physician practitioner and as supervising physician I was immediately available for consultation/collaboration.   Shelda Jakes, MD 10/28/11 2255

## 2012-03-28 ENCOUNTER — Emergency Department (HOSPITAL_COMMUNITY): Payer: Self-pay

## 2012-03-28 ENCOUNTER — Encounter (HOSPITAL_COMMUNITY): Payer: Self-pay | Admitting: Emergency Medicine

## 2012-03-28 ENCOUNTER — Inpatient Hospital Stay (HOSPITAL_COMMUNITY)
Admission: EM | Admit: 2012-03-28 | Discharge: 2012-03-30 | DRG: 418 | Disposition: A | Discharge status: Home / Self Care | Payer: MEDICAID | Attending: Surgery | Admitting: Surgery

## 2012-03-28 DIAGNOSIS — M5430 Sciatica, unspecified side: Secondary | ICD-10-CM | POA: Diagnosis present

## 2012-03-28 DIAGNOSIS — K819 Cholecystitis, unspecified: Secondary | ICD-10-CM

## 2012-03-28 DIAGNOSIS — F32A Depression, unspecified: Secondary | ICD-10-CM | POA: Diagnosis present

## 2012-03-28 DIAGNOSIS — F3289 Other specified depressive episodes: Secondary | ICD-10-CM | POA: Diagnosis present

## 2012-03-28 DIAGNOSIS — Z9089 Acquired absence of other organs: Secondary | ICD-10-CM

## 2012-03-28 DIAGNOSIS — F329 Major depressive disorder, single episode, unspecified: Secondary | ICD-10-CM | POA: Diagnosis present

## 2012-03-28 DIAGNOSIS — I10 Essential (primary) hypertension: Secondary | ICD-10-CM | POA: Diagnosis present

## 2012-03-28 DIAGNOSIS — Z6841 Body Mass Index (BMI) 40.0 and over, adult: Secondary | ICD-10-CM

## 2012-03-28 DIAGNOSIS — K8000 Calculus of gallbladder with acute cholecystitis without obstruction: Principal | ICD-10-CM | POA: Diagnosis present

## 2012-03-28 DIAGNOSIS — K8010 Calculus of gallbladder with chronic cholecystitis without obstruction: Secondary | ICD-10-CM

## 2012-03-28 DIAGNOSIS — Z888 Allergy status to other drugs, medicaments and biological substances status: Secondary | ICD-10-CM

## 2012-03-28 HISTORY — DX: Calculus of gallbladder with acute cholecystitis without obstruction: K80.00

## 2012-03-28 HISTORY — DX: Anxiety disorder, unspecified: F41.9

## 2012-03-28 HISTORY — DX: Morbid (severe) obesity due to excess calories: E66.01

## 2012-03-28 LAB — URINALYSIS, ROUTINE W REFLEX MICROSCOPIC
Glucose, UA: NEGATIVE mg/dL
Protein, ur: 30 mg/dL — AB
pH: 6.5 (ref 5.0–8.0)

## 2012-03-28 LAB — COMPREHENSIVE METABOLIC PANEL WITH GFR
ALT: 31 U/L (ref 0–35)
AST: 30 U/L (ref 0–37)
Albumin: 3.9 g/dL (ref 3.5–5.2)
Alkaline Phosphatase: 98 U/L (ref 39–117)
BUN: 7 mg/dL (ref 6–23)
CO2: 24 meq/L (ref 19–32)
Calcium: 9.2 mg/dL (ref 8.4–10.5)
Chloride: 98 meq/L (ref 96–112)
Creatinine, Ser: 0.67 mg/dL (ref 0.50–1.10)
GFR calc Af Amer: >90 mL/min
GFR calc non Af Amer: >90 mL/min
Glucose, Bld: 188 mg/dL — ABNORMAL HIGH (ref 70–99)
Potassium: 4.2 meq/L (ref 3.5–5.1)
Sodium: 137 meq/L (ref 135–145)
Total Bilirubin: 0.2 mg/dL — ABNORMAL LOW (ref 0.3–1.2)
Total Protein: 8.2 g/dL (ref 6.0–8.3)

## 2012-03-28 LAB — CBC WITH DIFFERENTIAL/PLATELET
Basophils Absolute: 0.1 K/uL (ref 0.0–0.1)
Basophils Relative: 1 % (ref 0–1)
Eosinophils Absolute: 0.1 K/uL (ref 0.0–0.7)
Eosinophils Relative: 0 % (ref 0–5)
HCT: 42 % (ref 36.0–46.0)
Hemoglobin: 13.7 g/dL (ref 12.0–15.0)
Lymphocytes Relative: 13 % (ref 12–46)
Lymphs Abs: 1.6 K/uL (ref 0.7–4.0)
MCH: 26.9 pg (ref 26.0–34.0)
MCHC: 32.6 g/dL (ref 30.0–36.0)
MCV: 82.4 fL (ref 78.0–100.0)
Monocytes Absolute: 0.4 K/uL (ref 0.1–1.0)
Monocytes Relative: 3 % (ref 3–12)
Neutro Abs: 10.2 K/uL — ABNORMAL HIGH (ref 1.7–7.7)
Neutrophils Relative %: 83 % — ABNORMAL HIGH (ref 43–77)
Platelets: 413 K/uL — ABNORMAL HIGH (ref 150–400)
RBC: 5.1 MIL/uL (ref 3.87–5.11)
RDW: 13.5 % (ref 11.5–15.5)
WBC: 12.3 K/uL — ABNORMAL HIGH (ref 4.0–10.5)

## 2012-03-28 LAB — LIPASE, BLOOD: Lipase: 25 U/L (ref 11–59)

## 2012-03-28 LAB — TROPONIN I: Troponin I: <0.3 ng/mL

## 2012-03-28 LAB — URINE MICROSCOPIC-ADD ON

## 2012-03-28 LAB — PREGNANCY, URINE: Preg Test, Ur: NEGATIVE

## 2012-03-28 MED ORDER — SODIUM CHLORIDE 0.9 % IV SOLN
3.0000 g | Freq: Four times a day (QID) | INTRAVENOUS | Status: DC
  Administered 2012-03-28 – 2012-03-30 (×7): 3 g via INTRAVENOUS
  Filled 2012-03-28 (×10): qty 3

## 2012-03-28 MED ORDER — ENOXAPARIN SODIUM 40 MG/0.4ML ~~LOC~~ SOLN
40.0000 mg | SUBCUTANEOUS | Status: DC
  Administered 2012-03-28 – 2012-03-29 (×2): 40 mg via SUBCUTANEOUS
  Filled 2012-03-28 (×3): qty 0.4

## 2012-03-28 MED ORDER — SODIUM CHLORIDE 0.9 % IV BOLUS (SEPSIS)
1000.0000 mL | Freq: Once | INTRAVENOUS | Status: AC
  Administered 2012-03-28: 1000 mL via INTRAVENOUS

## 2012-03-28 MED ORDER — KCL IN DEXTROSE-NACL 20-5-0.45 MEQ/L-%-% IV SOLN
INTRAVENOUS | Status: DC
  Administered 2012-03-28 – 2012-03-30 (×4): via INTRAVENOUS
  Filled 2012-03-28 (×7): qty 1000

## 2012-03-28 MED ORDER — MORPHINE SULFATE 4 MG/ML IJ SOLN
4.0000 mg | Freq: Once | INTRAMUSCULAR | Status: AC
  Administered 2012-03-28: 4 mg via INTRAVENOUS
  Filled 2012-03-28: qty 1

## 2012-03-28 MED ORDER — MORPHINE SULFATE 2 MG/ML IJ SOLN
1.0000 mg | INTRAMUSCULAR | Status: DC | PRN
  Administered 2012-03-28 – 2012-03-29 (×4): 2 mg via INTRAVENOUS
  Administered 2012-03-29: 4 mg via INTRAVENOUS
  Filled 2012-03-28 (×3): qty 1
  Filled 2012-03-28: qty 2
  Filled 2012-03-28: qty 1

## 2012-03-28 MED ORDER — ONDANSETRON HCL 4 MG/2ML IJ SOLN
4.0000 mg | Freq: Four times a day (QID) | INTRAMUSCULAR | Status: DC | PRN
  Administered 2012-03-28: 4 mg via INTRAVENOUS
  Filled 2012-03-28: qty 2

## 2012-03-28 MED ORDER — TRIAMTERENE-HCTZ 37.5-25 MG PO TABS
1.0000 | ORAL_TABLET | Freq: Every day | ORAL | Status: DC
  Administered 2012-03-30: 1 via ORAL
  Filled 2012-03-28 (×2): qty 1

## 2012-03-28 MED ORDER — ONDANSETRON HCL 4 MG/2ML IJ SOLN
4.0000 mg | Freq: Once | INTRAMUSCULAR | Status: AC
  Administered 2012-03-28: 4 mg via INTRAVENOUS
  Filled 2012-03-28: qty 2

## 2012-03-28 NOTE — ED Notes (Signed)
US at bedside

## 2012-03-28 NOTE — ED Notes (Signed)
States that she has midsternal chest pain with radiation to her back. States that she is relieved with vomiting.

## 2012-03-28 NOTE — H&P (Signed)
Reason for Consult:abdominal pain Referring Physician: Leighanne Adolph Mckee is an 47 y.o. female.  HPI: I was asked to evaluate this patient for epigastric and right upper quadrant abdominal pain which started acutely this morning at about 7:30, about one hour after rising. She said that this started suddenly and has been intense pain which she describes as a "deep" pain located in the epigastric and right upper quadrant regions her abdomen with some associated back pain. She tried to eat some eggs and sausage today to an she vomited this back up. She has vomited about 3 times as well with the last episode being more greenish. She did have a bowel movement today without any improvement. She feels some chills but no fevers. She has had some improvement with the pain medicine given in the emergency room but the pain only lets off for a while and then comes back. She denies any reflux or heartburn symptoms and denies any significant NSAID, steroid, or aspirin use. She denies any prior episodes of discomfort similar to this. She does have a white blood cell count of 12 an ultrasound which demonstrates multiple gallstones but no evidence of cholecystitis.  Past Medical History  Diagnosis Date  . Hypertension   . Depression   . Sciatica     Past Surgical History  Procedure Laterality Date  . Tonsillectomy    . Cesarean section      x 2  . Tubal ligation      History reviewed. No pertinent family history.  Social History:  reports that she has never smoked. She has never used smokeless tobacco. She reports that she does not drink alcohol or use illicit drugs.  Allergies:  Allergies  Allergen Reactions  . Bupropion Hcl Other (See Comments)    Unknown reaction: Pt states that she remembers welts post taking medication  . Squid Oil Swelling  . [place] Wort Rash    Medications: I have reviewed the patient's current medications.  Results for orders placed during the hospital  encounter of 03/28/12 (from the past 48 hour(s))  URINALYSIS, ROUTINE W REFLEX MICROSCOPIC     Status: Abnormal   Collection Time    03/28/12  3:26 PM      Result Value Range   Color, Urine YELLOW  YELLOW   APPearance CLOUDY (*) CLEAR   Specific Gravity, Urine 1.017  1.005 - 1.030   pH 6.5  5.0 - 8.0   Glucose, UA NEGATIVE  NEGATIVE mg/dL   Hgb urine dipstick LARGE (*) NEGATIVE   Bilirubin Urine NEGATIVE  NEGATIVE   Ketones, ur 15 (*) NEGATIVE mg/dL   Protein, ur 30 (*) NEGATIVE mg/dL   Urobilinogen, UA 0.2  0.0 - 1.0 mg/dL   Nitrite NEGATIVE  NEGATIVE   Leukocytes, UA SMALL (*) NEGATIVE  PREGNANCY, URINE     Status: None   Collection Time    03/28/12  3:26 PM      Result Value Range   Preg Test, Ur NEGATIVE  NEGATIVE   Comment:            THE SENSITIVITY OF THIS     METHODOLOGY IS >20 mIU/mL.  URINE MICROSCOPIC-ADD ON     Status: None   Collection Time    03/28/12  3:26 PM      Result Value Range   Squamous Epithelial / LPF RARE  RARE   WBC, UA 3-6  <3 WBC/hpf   RBC / HPF 7-10  <3 RBC/hpf  Bacteria, UA RARE  RARE  CBC WITH DIFFERENTIAL     Status: Abnormal   Collection Time    03/28/12  3:54 PM      Result Value Range   WBC 12.3 (*) 4.0 - 10.5 K/uL   RBC 5.10  3.87 - 5.11 MIL/uL   Hemoglobin 13.7  12.0 - 15.0 g/dL   HCT 40.9  81.1 - 91.4 %   MCV 82.4  78.0 - 100.0 fL   MCH 26.9  26.0 - 34.0 pg   MCHC 32.6  30.0 - 36.0 g/dL   RDW 78.2  95.6 - 21.3 %   Platelets 413 (*) 150 - 400 K/uL   Neutrophils Relative 83 (*) 43 - 77 %   Neutro Abs 10.2 (*) 1.7 - 7.7 K/uL   Lymphocytes Relative 13  12 - 46 %   Lymphs Abs 1.6  0.7 - 4.0 K/uL   Monocytes Relative 3  3 - 12 %   Monocytes Absolute 0.4  0.1 - 1.0 K/uL   Eosinophils Relative 0  0 - 5 %   Eosinophils Absolute 0.1  0.0 - 0.7 K/uL   Basophils Relative 1  0 - 1 %   Basophils Absolute 0.1  0.0 - 0.1 K/uL  COMPREHENSIVE METABOLIC PANEL     Status: Abnormal   Collection Time    03/28/12  3:54 PM      Result Value  Range   Sodium 137  135 - 145 mEq/L   Potassium 4.2  3.5 - 5.1 mEq/L   Chloride 98  96 - 112 mEq/L   CO2 24  19 - 32 mEq/L   Glucose, Bld 188 (*) 70 - 99 mg/dL   BUN 7  6 - 23 mg/dL   Creatinine, Ser 0.86  0.50 - 1.10 mg/dL   Calcium 9.2  8.4 - 57.8 mg/dL   Total Protein 8.2  6.0 - 8.3 g/dL   Albumin 3.9  3.5 - 5.2 g/dL   AST 30  0 - 37 U/L   ALT 31  0 - 35 U/L   Alkaline Phosphatase 98  39 - 117 U/L   Total Bilirubin 0.2 (*) 0.3 - 1.2 mg/dL   GFR calc non Af Amer >90  >90 mL/min   GFR calc Af Amer >90  >90 mL/min   Comment:            The eGFR has been calculated     using the CKD EPI equation.     This calculation has not been     validated in all clinical     situations.     eGFR's persistently     <90 mL/min signify     possible Chronic Kidney Disease.  LIPASE, BLOOD     Status: None   Collection Time    03/28/12  3:54 PM      Result Value Range   Lipase 25  11 - 59 U/L  TROPONIN I     Status: None   Collection Time    03/28/12  3:54 PM      Result Value Range   Troponin I <0.30  <0.30 ng/mL   Comment:            Due to the release kinetics of cTnI,     a negative result within the first hours     of the onset of symptoms does not rule out     myocardial infarction with certainty.     If myocardial  infarction is still suspected,     repeat the test at appropriate intervals.    US Abdomen Complete  03/28/2012  *RADIOLOGY REPORT*  Clinical Data:  Abdominal pain with nausea and vomiting  ABDOMINAL ULTRASOUND COMPLETE  Comparison:  None.  Findings:  Gallbladder:  Multiple gallstones are present.  The largest individual stone visualized measures 1.7 cm in size.  Gallbladder wall thickness remains within normal limits (2.8 mm).  The sonographic Murphy's sign is negative.  Common Bile Duct:  Within normal limits in caliber. Measures 4.9 mm.  Liver: The echogenicity of the liver is increased and heterogeneous.  This suggests fatty infiltration of the liver.  At least two  hypoechoic lesions are noted in the liver, the largest measuring 1.3 x 1.5 x 1.6 cm.  The smaller lesion measures 1.8 x 1.3 x 1.0 cm.  These are not definitively characterized by ultrasound.  IVC:  Appears normal.  Pancreas:  Although the pancreas is difficult to visualize in its entirety, no focal pancreatic abnormality is identified.  Spleen:  Within normal limits in size and echotexture. Measures 7.9 cm in sagittal length.  Right kidney:  Normal in size and parenchymal echogenicity.  No evidence of mass or hydronephrosis.  Left kidney:  Normal in size and parenchymal echogenicity.  No evidence of mass or hydronephrosis.  Abdominal Aorta:  No aneurysm identified. Distal portion of the abdominal aorta is not well visualized due to bowel gas.  IMPRESSION:  1.  Cholelithiasis.  The 2.  Two small hypoechoic lesions in the liver.  No prior abdominal imaging is available for comparison.  Further evaluation with CT of the abdomen with contrast could be considered. 3.  Fatty infiltration of the liver.   Original Report Authenticated By: Britta Mccreedy, M.D.     All other review of systems negative or noncontributory except as stated in the HPI   Blood pressure 147/82, pulse 69, temperature 98.7 F (37.1 C), temperature source Oral, resp. rate 22, last menstrual period 03/17/2012, SpO2 98.00%. General appearance: alert, cooperative and no distress Head: Normocephalic, without obvious abnormality, atraumatic Eyes: negative Neck: no JVD and supple, symmetrical, trachea midline Resp: clear to auscultation bilaterally Cardio: regular rate and rhythm, S1, S2 normal, no murmur, click, rub or gallop GI: soft, upper abdominal tenderness to moderate palpation, greatest in RUQ, ND, no peritoneal signs Extremities: extremities normal, atraumatic, no cyanosis or edema Pulses: 2+ and symmetric Skin: Skin color, texture, turgor normal. No rashes or lesions Neurologic: Grossly normal  Assessment/Plan: Abdominal pain and  cholelithiasis I think that this most likely represents symptomatic cholelithiasis and possible acute cholecystitis, especially given her unrelenting symptoms. She has no evidence of cholecystitis on ultrasound, but she does have persistent focal right lower quadrant tenderness and an elevated white blood cell count. Her LFTs are normal and there is no evidence of any choledocholithiasis. I have recommended cholecystectomy. We will plan for admission with IV fluids and antibiotics and pain control and we will keep her n.p.o. And plan for cholecystectomy. I did discuss with her the procedures and the risks. The risks of infection, bleeding, pain, persistent symptoms, scarring, injury to bowel or bile ducts, retained stone, diarrhea, need for additional procedures, and need for open surgery discussed with the patient. She agreed with the plan of and would like to proceed with cholecystectomy.   Lodema Pilot DAVID 03/28/2012, 6:32 PM

## 2012-03-28 NOTE — ED Notes (Signed)
Pt sts epigastric pain started this morning.  Relived by vomiting.  Pain score 8/10.  Vitals are stable.

## 2012-03-28 NOTE — ED Provider Notes (Signed)
History  This chart was scribed for [name], MD by [name], ED Scribe. The patient was seen in room RESA/RESA. Patient's care was started at 1503.  CSN: [id number]  Arrival date & time 03/28/12  1354   First MD Initiated Contact with Patient 03/28/12 1503      Chief Complaint  Patient presents with  . Abdominal Pain  . Emesis     Patient is a 47 y.o. female presenting with vomiting and abdominal pain. The history is provided by the patient. No language interpreter was used.  Emesis Associated symptoms: abdominal pain   Associated symptoms: no chills and no diarrhea   Abdominal Pain Pain location:  Epigastric and RUQ Pain radiates to:  Does not radiate Pain severity:  Moderate Onset quality:  Sudden Timing:  Constant Progression:  Unchanged Chronicity:  New Relieved by:  Nothing Worsened by:  Vomiting Associated symptoms: nausea and vomiting   Associated symptoms: no chills, no diarrhea and no fever   Vomiting:    Severity:  Mild   Timing:  Sporadic    HPI Comments: Yvonne Mckee is a 47 y.o. female who presents to the Emergency Department complaining of sudden epigastric and RUQ abdominal pain and emesis that began 7.5 hours ago.  Pain is moderate in severity and is constant. Pain is relieved transiently with vomiting.  She denies history of similar pain. She has had 3 episodes of emesis of stomach contents since symptom onset. She states that she had been awake for a few hours before pain began. There is associated nausea and diaphoresis. Patient denies diarrhea, hematemesis, fever or chills. Patient has not tried any medicines for symptom relief. She denies history of gastro problems. She has a medical history of hypertension, depression and sciatica.  Past Medical History  Diagnosis Date  . Hypertension   . Depression   . Sciatica   . Anxiety     Past Surgical History  Procedure Laterality Date  . Tonsillectomy    . Cesarean section      x 2  .  Tubal ligation      Family History  Problem Relation Age of Onset  . Asthma Daughter     History  Substance Use Topics  . Smoking status: Never Smoker   . Smokeless tobacco: Never Used  . Alcohol Use: No    OB History   Grav Para Term Preterm Abortions TAB SAB Ect Mult Living                  Review of Systems  Constitutional: Negative for fever and chills.  Gastrointestinal: Positive for nausea, vomiting and abdominal pain. Negative for diarrhea.  All other systems reviewed and are negative.    Allergies  Bupropion hcl; Squid oil; and St johns wort  Home Medications   No current outpatient prescriptions on file.  Triage Vitals: BP 149/69  Pulse 72  Temp(Src) 98.7 F (37.1 C) (Oral)  Resp 21  SpO2 95%  LMP 03/17/2012  Physical Exam  Constitutional: She appears well-developed and well-nourished. No distress.  HENT:  Head: Normocephalic and atraumatic.  Mouth/Throat: Mucous membranes are dry.  Eyes: Conjunctivae and EOM are normal. Pupils are equal, round, and reactive to light.  Neck: Normal range of motion. Neck supple.  Cardiovascular: Normal rate, regular rhythm and normal heart sounds.   Pulmonary/Chest: Effort normal and breath sounds normal.  Abdominal: Soft. Bowel sounds are normal. She exhibits no distension. There is tenderness in the right upper quadrant and  epigastric area. There is no rebound and no guarding.  Musculoskeletal: Normal range of motion.  Neurological: She is alert.  Skin: Skin is warm and dry. No rash noted.  Psychiatric: She has a normal mood and affect.    ED Course  Procedures (including critical care time) DIAGNOSTIC STUDIES: Oxygen Saturation is 95% on room air, adequate by my interpretation.    COORDINATION OF CARE: 3:24 PM- Patient informed of current plan for treatment and evaluation and agrees with plan at this time.   5:25 PM- Gallstones visualized on [place]. Improved pain after second dose of pain medication. Upon  second exam, epigastric tenderness palpated. Spoke with Dr. Darylene Price who is on call for surgery. He will come to evaluate the patient.  6:40 PM- [name] to admit patient.    Labs Reviewed  CBC WITH DIFFERENTIAL - Abnormal; Notable for the following:    WBC 12.3 (*)    Platelets 413 (*)    Neutrophils Relative 83 (*)    Neutro Abs 10.2 (*)    All other components within normal limits  COMPREHENSIVE METABOLIC PANEL - Abnormal; Notable for the following:    Glucose, Bld 188 (*)    Total Bilirubin 0.2 (*)    All other components within normal limits  URINALYSIS, ROUTINE W REFLEX MICROSCOPIC - Abnormal; Notable for the following:    APPearance CLOUDY (*)    Hgb urine dipstick LARGE (*)    Ketones, ur 15 (*)    Protein, ur 30 (*)    Leukocytes, UA SMALL (*)    All other components within normal limits  MRSA PCR SCREENING  LIPASE, BLOOD  PREGNANCY, URINE  TROPONIN I  URINE MICROSCOPIC-ADD ON  COMPREHENSIVE METABOLIC PANEL  CBC    US Abdomen Complete  03/28/2012  *RADIOLOGY REPORT*  Clinical Data:  Abdominal pain with nausea and vomiting  ABDOMINAL ULTRASOUND COMPLETE  Comparison:  None.  Findings:  Gallbladder:  Multiple gallstones are present.  The largest individual stone visualized measures 1.7 cm in size.  Gallbladder wall thickness remains within normal limits (2.8 mm).  The sonographic Murphy's sign is negative.  Common Bile Duct:  Within normal limits in caliber. Measures 4.9 mm.  Liver: The echogenicity of the liver is increased and heterogeneous.  This suggests fatty infiltration of the liver.  At least two hypoechoic lesions are noted in the liver, the largest measuring 1.3 x 1.5 x 1.6 cm.  The smaller lesion measures 1.8 x 1.3 x 1.0 cm.  These are not definitively characterized by ultrasound.  IVC:  Appears normal.  Pancreas:  Although the pancreas is difficult to visualize in its entirety, no focal pancreatic abnormality is identified.  Spleen:  Within normal limits in size and  echotexture. Measures 7.9 cm in sagittal length.  Right kidney:  Normal in size and parenchymal echogenicity.  No evidence of mass or hydronephrosis.  Left kidney:  Normal in size and parenchymal echogenicity.  No evidence of mass or hydronephrosis.  Abdominal Aorta:  No aneurysm identified. Distal portion of the abdominal aorta is not well visualized due to bowel gas.  IMPRESSION:  1.  Cholelithiasis.  The 2.  Two small hypoechoic lesions in the liver.  No prior abdominal imaging is available for comparison.  Further evaluation with CT of the abdomen with contrast could be considered. 3.  Fatty infiltration of the liver.   Original Report Authenticated By: Britta Mccreedy, M.D.      1. Cholecystitis       MDM  I  personally performed the services described in this documentation, which was scribed in my presence. The recorded information has been reviewed and is accurate.    [name], MD 03/28/12 2351

## 2012-03-29 ENCOUNTER — Inpatient Hospital Stay (HOSPITAL_COMMUNITY): Payer: Self-pay | Admitting: Anesthesiology

## 2012-03-29 ENCOUNTER — Encounter (HOSPITAL_COMMUNITY): Admission: EM | Disposition: A | Discharge status: Home / Self Care | Payer: Self-pay | Source: Home / Self Care

## 2012-03-29 ENCOUNTER — Inpatient Hospital Stay (HOSPITAL_COMMUNITY): Payer: Self-pay

## 2012-03-29 ENCOUNTER — Encounter (HOSPITAL_COMMUNITY): Payer: Self-pay | Admitting: Anesthesiology

## 2012-03-29 ENCOUNTER — Encounter (HOSPITAL_COMMUNITY): Payer: Self-pay

## 2012-03-29 HISTORY — PX: CHOLECYSTECTOMY: SHX55

## 2012-03-29 LAB — CBC
MCH: 26.2 pg (ref 26.0–34.0)
MCV: 82.5 fL (ref 78.0–100.0)
Platelets: 422 10*3/uL — ABNORMAL HIGH (ref 150–400)
RDW: 13.8 % (ref 11.5–15.5)
WBC: 20.7 10*3/uL — ABNORMAL HIGH (ref 4.0–10.5)

## 2012-03-29 LAB — COMPREHENSIVE METABOLIC PANEL
AST: 22 U/L (ref 0–37)
Albumin: 3 g/dL — ABNORMAL LOW (ref 3.5–5.2)
Alkaline Phosphatase: 76 U/L (ref 39–117)
BUN: 7 mg/dL (ref 6–23)
CO2: 28 mEq/L (ref 19–32)
Chloride: 99 mEq/L (ref 96–112)
GFR calc non Af Amer: >90 mL/min (ref >90)
Potassium: 4 mEq/L (ref 3.5–5.1)
Total Bilirubin: 0.2 mg/dL — ABNORMAL LOW (ref 0.3–1.2)

## 2012-03-29 SURGERY — LAPAROSCOPIC CHOLECYSTECTOMY WITH INTRAOPERATIVE CHOLANGIOGRAM
Anesthesia: General | Wound class: Clean Contaminated

## 2012-03-29 MED ORDER — PROPOFOL 10 MG/ML IV BOLUS
INTRAVENOUS | Status: DC | PRN
  Administered 2012-03-29: 50 mg via INTRAVENOUS
  Administered 2012-03-29: 150 mg via INTRAVENOUS

## 2012-03-29 MED ORDER — ACETAMINOPHEN 10 MG/ML IV SOLN: 1000.0000 mg | Freq: Once | INTRAVENOUS | Status: DC | PRN

## 2012-03-29 MED ORDER — EPHEDRINE SULFATE 50 MG/ML IJ SOLN
INTRAMUSCULAR | Status: DC | PRN
  Administered 2012-03-29 (×2): 5 mg via INTRAVENOUS

## 2012-03-29 MED ORDER — MEPERIDINE HCL 50 MG/ML IJ SOLN: 6.2500 mg | INTRAMUSCULAR | Status: DC | PRN

## 2012-03-29 MED ORDER — LACTATED RINGERS IV SOLN
INTRAVENOUS | Status: DC | PRN
  Administered 2012-03-29: 1000 mL via INTRAVENOUS

## 2012-03-29 MED ORDER — HYDROMORPHONE HCL PF 1 MG/ML IJ SOLN
1.0000 mg | INTRAMUSCULAR | Status: DC | PRN
  Administered 2012-03-29 – 2012-03-30 (×6): 1 mg via INTRAVENOUS
  Filled 2012-03-29 (×6): qty 1

## 2012-03-29 MED ORDER — GLYCOPYRROLATE 0.2 MG/ML IJ SOLN
INTRAMUSCULAR | Status: DC | PRN
  Administered 2012-03-29: .2 mg via INTRAVENOUS

## 2012-03-29 MED ORDER — ACETAMINOPHEN 10 MG/ML IV SOLN
INTRAVENOUS | Status: DC | PRN
  Administered 2012-03-29: 1000 mg via INTRAVENOUS

## 2012-03-29 MED ORDER — IOHEXOL 300 MG/ML  SOLN
INTRAMUSCULAR | Status: DC | PRN
  Administered 2012-03-29: 50 mL via INTRAVENOUS

## 2012-03-29 MED ORDER — HYDROMORPHONE HCL PF 1 MG/ML IJ SOLN: 0.2500 mg | INTRAMUSCULAR | Status: DC | PRN

## 2012-03-29 MED ORDER — SUCCINYLCHOLINE CHLORIDE 20 MG/ML IJ SOLN
INTRAMUSCULAR | Status: DC | PRN
  Administered 2012-03-29: 100 mg via INTRAVENOUS

## 2012-03-29 MED ORDER — FENTANYL CITRATE 0.05 MG/ML IJ SOLN
INTRAMUSCULAR | Status: DC | PRN
  Administered 2012-03-29: 100 ug via INTRAVENOUS
  Administered 2012-03-29 (×3): 50 ug via INTRAVENOUS

## 2012-03-29 MED ORDER — ONDANSETRON HCL 4 MG/2ML IJ SOLN
INTRAMUSCULAR | Status: DC | PRN
  Administered 2012-03-29: 4 mg via INTRAVENOUS

## 2012-03-29 MED ORDER — BUPIVACAINE-EPINEPHRINE 0.25% -1:200000 IJ SOLN
INTRAMUSCULAR | Status: DC | PRN
  Administered 2012-03-29: 30 mL

## 2012-03-29 MED ORDER — OXYCODONE-ACETAMINOPHEN 5-325 MG PO TABS: 1.0000 | ORAL_TABLET | ORAL | Status: DC | PRN

## 2012-03-29 MED ORDER — PROMETHAZINE HCL 25 MG/ML IJ SOLN: 6.2500 mg | INTRAMUSCULAR | Status: DC | PRN

## 2012-03-29 MED ORDER — ROCURONIUM BROMIDE 100 MG/10ML IV SOLN
INTRAVENOUS | Status: DC | PRN
  Administered 2012-03-29: 30 mg via INTRAVENOUS
  Administered 2012-03-29: 10 mg via INTRAVENOUS

## 2012-03-29 MED ORDER — NEOSTIGMINE METHYLSULFATE 1 MG/ML IJ SOLN
INTRAMUSCULAR | Status: DC | PRN
  Administered 2012-03-29: 3 mg via INTRAVENOUS

## 2012-03-29 MED ORDER — DEXAMETHASONE SODIUM PHOSPHATE 10 MG/ML IJ SOLN
INTRAMUSCULAR | Status: DC | PRN
  Administered 2012-03-29: 10 mg via INTRAVENOUS

## 2012-03-29 MED ORDER — LIDOCAINE HCL (PF) 2 % IJ SOLN
INTRAMUSCULAR | Status: DC | PRN
  Administered 2012-03-29: 20 mg

## 2012-03-29 MED ORDER — OXYCODONE HCL 5 MG PO TABS: 5.0000 mg | ORAL_TABLET | Freq: Once | ORAL | Status: DC | PRN

## 2012-03-29 MED ORDER — MIDAZOLAM HCL 5 MG/5ML IJ SOLN
INTRAMUSCULAR | Status: DC | PRN
  Administered 2012-03-29: 2 mg via INTRAVENOUS

## 2012-03-29 MED ORDER — OXYCODONE HCL 5 MG/5ML PO SOLN
5.0000 mg | Freq: Once | ORAL | Status: DC | PRN
  Filled 2012-03-29: qty 5

## 2012-03-29 MED ORDER — LACTATED RINGERS IV SOLN
INTRAVENOUS | Status: DC
  Administered 2012-03-29: 1000 mL via INTRAVENOUS

## 2012-03-29 SURGICAL SUPPLY — 40 items
ADH SKN CLS APL DERMABOND .7 (GAUZE/BANDAGES/DRESSINGS) ×2
APPLIER CLIP ROT 10 11.4 M/L (STAPLE) ×4
APR CLP MED LRG 11.4X10 (STAPLE) ×2
BAG SPEC RTRVL LRG 6X4 10 (ENDOMECHANICALS) ×1
CANISTER SUCTION 2500CC (MISCELLANEOUS) ×2 IMPLANT
CHLORAPREP W/TINT 26ML (MISCELLANEOUS) ×2 IMPLANT
CLIP APPLIE ROT 10 11.4 M/L (STAPLE) ×1 IMPLANT
CLOTH BEACON ORANGE TIMEOUT ST (SAFETY) ×2 IMPLANT
COVER MAYO STAND STRL (DRAPES) ×2 IMPLANT
DECANTER SPIKE VIAL GLASS SM (MISCELLANEOUS) ×2 IMPLANT
DERMABOND ADVANCED (GAUZE/BANDAGES/DRESSINGS) ×2
DERMABOND ADVANCED .7 DNX12 (GAUZE/BANDAGES/DRESSINGS) IMPLANT
DRAPE C-ARM 42X72 X-RAY (DRAPES) ×2 IMPLANT
DRAPE LAPAROSCOPIC ABDOMINAL (DRAPES) ×2 IMPLANT
DRAPE UTILITY XL STRL (DRAPES) ×1 IMPLANT
DRAPE WARM FLUID 44X44 (DRAPE) ×2 IMPLANT
ELECT REM PT RETURN 9FT ADLT (ELECTROSURGICAL) ×2
ELECTRODE REM PT RTRN 9FT ADLT (ELECTROSURGICAL) ×1 IMPLANT
FILTER SMOKE EVAC LAPAROSHD (FILTER) ×2 IMPLANT
GLOVE BIOGEL PI IND STRL 7.0 (GLOVE) ×1 IMPLANT
GLOVE BIOGEL PI INDICATOR 7.0 (GLOVE) ×1
GLOVE INDICATOR 8.0 STRL GRN (GLOVE) ×4 IMPLANT
GLOVE SS BIOGEL STRL SZ 8 (GLOVE) ×1 IMPLANT
GLOVE SUPERSENSE BIOGEL SZ 8 (GLOVE) ×1
GOWN STRL NON-REIN LRG LVL3 (GOWN DISPOSABLE) ×1 IMPLANT
GOWN STRL REIN XL XLG (GOWN DISPOSABLE) ×6 IMPLANT
HEMOSTAT SURGICEL 4X8 (HEMOSTASIS) IMPLANT
KIT BASIN OR (CUSTOM PROCEDURE TRAY) ×2 IMPLANT
POUCH SPECIMEN RETRIEVAL 10MM (ENDOMECHANICALS) ×1 IMPLANT
SCISSORS LAP 5X35 DISP (ENDOMECHANICALS) IMPLANT
SET CHOLANGIOGRAPH MIX (MISCELLANEOUS) ×2 IMPLANT
SET IRRIG TUBING LAPAROSCOPIC (IRRIGATION / IRRIGATOR) ×2 IMPLANT
SUT MNCRL AB 4-0 PS2 18 (SUTURE) ×2 IMPLANT
TOWEL OR 17X26 10 PK STRL BLUE (TOWEL DISPOSABLE) ×2 IMPLANT
TOWEL OR NON WOVEN STRL DISP B (DISPOSABLE) ×1 IMPLANT
TRAY LAP CHOLE (CUSTOM PROCEDURE TRAY) ×2 IMPLANT
TROCAR BLADELESS OPT 5 75 (ENDOMECHANICALS) ×4 IMPLANT
TROCAR XCEL BLUNT TIP 100MML (ENDOMECHANICALS) ×2 IMPLANT
TROCAR XCEL NON-BLD 11X100MML (ENDOMECHANICALS) ×2 IMPLANT
TUBING INSUFFLATION 10FT LAP (TUBING) ×2 IMPLANT

## 2012-03-29 NOTE — Transfer of Care (Signed)
Immediate Anesthesia Transfer of Care Note  Patient: Yvonne Mckee  Procedure(s) Performed: Procedure(s): LAPAROSCOPIC CHOLECYSTECTOMY WITH INTRAOPERATIVE CHOLANGIOGRAM (N/A)  Patient Location: PACU  Anesthesia Type:General  Level of Consciousness: awake, oriented and patient cooperative  Airway & Oxygen Therapy: Patient Spontanous Breathing and Patient connected to face mask oxygen  Post-op Assessment: Report given to PACU RN, Post -op Vital signs reviewed and stable and Patient moving all extremities X 4  Post vital signs: Reviewed and stable  Complications: No apparent anesthesia complications

## 2012-03-29 NOTE — Op Note (Signed)
Laparoscopic Cholecystectomy with IOC Procedure Note  Indications: This patient presents with symptomatic gallbladder disease and will undergo laparoscopic cholecystectomy.The procedure has been discussed with the patient. Operative and non operative treatments have been discussed. Risks of surgery include bleeding, infection,  Common bile duct injury,  Injury to the stomach,liver, colon,small intestine, abdominal wall,  Diaphragm,  Major blood vessels,  And the need for an open procedure.  Other risks include worsening of medical problems, death,  DVT and pulmonary embolism, and cardiovascular events.   Medical options have also been discussed. The patient has been informed of long term expectations of surgery and non surgical options,  The patient agrees to proceed.    Pre-operative Diagnosis: Calculus of gallbladder with acute cholecystitis, without mention of obstruction  Post-operative Diagnosis: Same  Surgeon: Mardy Hoppe A.   Assistants: OR staff  Anesthesia: General endotracheal anesthesia and Local anesthesia 0.25.% bupivacaine, with epinephrine  ASA Class: 2  Procedure Details  The patient was seen again in the Holding Room. The risks, benefits, complications, treatment options, and expected outcomes were discussed with the patient. The possibilities of reaction to medication, pulmonary aspiration, perforation of viscus, bleeding, recurrent infection, finding a normal gallbladder, the need for additional procedures, failure to diagnose a condition, the possible need to convert to an open procedure, and creating a complication requiring transfusion or operation were discussed with the patient. The patient and/or family concurred with the proposed plan, giving informed consent. The site of surgery properly noted/marked. The patient was taken to Operating Room, identified as Yvonne Mckee and the procedure verified as Laparoscopic Cholecystectomy with Intraoperative Cholangiograms. A  Time Out was held and the above information confirmed.  Prior to the induction of general anesthesia, antibiotic prophylaxis was administered. General endotracheal anesthesia was then administered and tolerated well. After the induction, the abdomen was prepped in the usual sterile fashion. The patient was positioned in the supine position with the left arm comfortably tucked, along with some reverse Trendelenburg.  Local anesthetic agent was injected into the skin near the umbilicus and an incision made. The midline fascia was incised and the Hasson technique was used to introduce a 12 mm port under direct vision. It was secured with a figure of eight Vicryl suture placed in the usual fashion. Pneumoperitoneum was then created with CO2 and tolerated well without any adverse changes in the patient's vital signs. Additional trocars were introduced under direct vision with an 11 mm trocar in the epigastrium and 2 5 mm trocars in the right upper quadrant. All skin incisions were infiltrated with a local anesthetic agent before making the incision and placing the trocars.   The gallbladder was identified, the fundus grasped and retracted cephalad. Adhesions were lysed bluntly and with the electrocautery where indicated, taking care not to injure any adjacent organs or viscus. The infundibulum was grasped and retracted laterally, exposing the peritoneum overlying the triangle of Calot. This was then divided and exposed in a blunt fashion. The cystic duct was clearly identified and bluntly dissected circumferentially. The junctions of the gallbladder, cystic duct and common bile duct were clearly identified prior to the division of any linear structure.   An incision was made in the cystic duct and the cholangiogram catheter introduced. The catheter was secured using an endoclip. The study showed no stones and good visualization of the distal and proximal biliary tree. The catheter was then removed.   The cystic  duct was then  ligated with surgical clips  on the patient side  and  clipped on the gallbladder side and divided. The cystic artery was identified, dissected free, ligated with clips and divided as well. Posterior cystic artery clipped and divided.  The gallbladder was dissected from the liver bed in retrograde fashion with the electrocautery. The gallbladder was removed. The liver bed was irrigated and inspected. Hemostasis was achieved with the electrocautery. Copious irrigation was utilized and was repeatedly aspirated until clear all particulate matter. Hemostasis was achieved with no signs  Of bleeding or bile leakage.  Pneumoperitoneum was completely reduced after viewing removal of the trocars under direct vision. The wound was thoroughly irrigated and the fascia was then closed with a figure of eight suture; the skin was then closed with 4 0 monocryl  and Dermabond was applied.  Instrument, sponge, and needle counts were correct at closure and at the conclusion of the case.   Findings: Cholecystitis with Cholelithiasis  Estimated Blood Loss: less than 100 mL         Drains: none         Total IV Fluids: 1600 mL         Specimens: Gallbladder           Complications: None; patient tolerated the procedure well.         Disposition: PACU - hemodynamically stable.         Condition: stable

## 2012-03-29 NOTE — Interval H&P Note (Signed)
History and Physical Interval Note:  03/29/2012 12:17 PM  Yvonne Mckee  has presented today for surgery, with the diagnosis of cholelithiasis  The various methods of treatment have been discussed with the patient and family. After consideration of risks, benefits and other options for treatment, the patient has consented to  Procedure(s): LAPAROSCOPIC CHOLECYSTECTOMY WITH INTRAOPERATIVE CHOLANGIOGRAM (N/A) as a surgical intervention .  The patient's history has been reviewed, patient examined, no change in status, stable for surgery.  I have reviewed the patient's chart and labs.  Questions were answered to the patient's satisfaction.     Ahmyah Gidley A.

## 2012-03-29 NOTE — Progress Notes (Signed)
Subjective: Complaining of pain which is constant, even with pain med, in the mid line and RUQ.  No complaints of nausea.  Objective: Vital signs in last 24 hours: Temp:  [97.8 F (36.6 C)-98.7 F (37.1 C)] 98.1 F (36.7 C) (02/12 0625) Pulse Rate:  [68-81] 81 (02/12 0625) Resp:  [18-22] 18 (02/12 0625) BP: (125-161)/(69-85) 144/85 mmHg (02/12 0625) SpO2:  [95 %-99 %] 95 % (02/12 0625) Weight:  [231 lb 0.7 oz (104.8 kg)] 231 lb 0.7 oz (104.8 kg) (02/11 2026) Last BM Date: 03/28/12  NPO, VSS, LFT's are normal, WBC is up  Intake/Output from previous day: 02/11 0701 - 02/12 0700 In: 1050 [I.V.:1050] Out: -  Intake/Output this shift:    General appearance: alert, cooperative and mild discomfort Abd: soft, very tender with ongoing pain midline and RUQ.  Lab Results:   Recent Labs  03/28/12 1554 03/29/12 0455  WBC 12.3* 20.7*  HGB 13.7 12.0  HCT 42.0 37.8  PLT 413* 422*    BMET  Recent Labs  03/28/12 1554 03/29/12 0455  NA 137 135  K 4.2 4.0  CL 98 99  CO2 24 28  GLUCOSE 188* 183*  BUN 7 7  CREATININE 0.67 0.64  CALCIUM 9.2 8.1*   PT/INR No results found for this basename: LABPROT, INR,  in the last 72 hours   Recent Labs Lab 03/28/12 1554 03/29/12 0455  AST 30 22  ALT 31 27  ALKPHOS 98 76  BILITOT 0.2* 0.2*  PROT 8.2 6.5  ALBUMIN 3.9 3.0*     Lipase     Component Value Date/Time   LIPASE 25 03/28/2012 1554     Studies/Results: US Abdomen Complete  03/28/2012  *RADIOLOGY REPORT*  Clinical Data:  Abdominal pain with nausea and vomiting  ABDOMINAL ULTRASOUND COMPLETE  Comparison:  None.  Findings:  Gallbladder:  Multiple gallstones are present.  The largest individual stone visualized measures 1.7 cm in size.  Gallbladder wall thickness remains within normal limits (2.8 mm).  The sonographic Murphy's sign is negative.  Common Bile Duct:  Within normal limits in caliber. Measures 4.9 mm.  Liver: The echogenicity of the liver is increased and  heterogeneous.  This suggests fatty infiltration of the liver.  At least two hypoechoic lesions are noted in the liver, the largest measuring 1.3 x 1.5 x 1.6 cm.  The smaller lesion measures 1.8 x 1.3 x 1.0 cm.  These are not definitively characterized by ultrasound.  IVC:  Appears normal.  Pancreas:  Although the pancreas is difficult to visualize in its entirety, no focal pancreatic abnormality is identified.  Spleen:  Within normal limits in size and echotexture. Measures 7.9 cm in sagittal length.  Right kidney:  Normal in size and parenchymal echogenicity.  No evidence of mass or hydronephrosis.  Left kidney:  Normal in size and parenchymal echogenicity.  No evidence of mass or hydronephrosis.  Abdominal Aorta:  No aneurysm identified. Distal portion of the abdominal aorta is not well visualized due to bowel gas.  IMPRESSION:  1.  Cholelithiasis.  The 2.  Two small hypoechoic lesions in the liver.  No prior abdominal imaging is available for comparison.  Further evaluation with CT of the abdomen with contrast could be considered. 3.  Fatty infiltration of the liver.   Original Report Authenticated By: Britta Mccreedy, M.D.     Medications: . ampicillin-sulbactam (UNASYN) IV  3 g Intravenous Q6H  . enoxaparin (LOVENOX) injection  40 mg Subcutaneous Q24H  . triamterene-hydrochlorothiazide  1  each Oral Daily    Assessment/Plan Abdominal pain and cholelithiasis Hypertension Depression  Sciatica  Body mass index is 40.9   Plan:  She is NPO and on Unasyn, plan OR later today.     LOS: 1 day    Yvonne Mckee 03/29/2012

## 2012-03-29 NOTE — Progress Notes (Signed)
ACUTE CHOLECYSTITIS.  LAP CHOLE TODAY. The procedure has been discussed with the patient. Operative and non operative treatments have been discussed. Risks of surgery include bleeding, infection,  Common bile duct injury,  Injury to the stomach,liver, colon,small intestine, abdominal wall,  Diaphragm,  Major blood vessels,  And the need for an open procedure.  Other risks include worsening of medical problems, death,  DVT and pulmonary embolism, and cardiovascular events.   Medical options have also been discussed. The patient has been informed of long term expectations of surgery and non surgical options,  The patient agrees to proceed.

## 2012-03-29 NOTE — Anesthesia Preprocedure Evaluation (Addendum)
Anesthesia Evaluation  Patient identified by MRN, date of birth, ID band Patient awake    Reviewed: Allergy & Precautions, H&P , NPO status , Patient's Chart, lab work & pertinent test results  Airway Mallampati: II TM Distance: >3 FB Neck ROM: Full    Dental  (+) Dental Advisory Given and Teeth Intact   Pulmonary neg pulmonary ROS,  breath sounds clear to auscultation  Pulmonary exam normal       Cardiovascular hypertension, Pt. on medications Rhythm:Regular Rate:Normal     Neuro/Psych negative neurological ROS  negative psych ROS   GI/Hepatic Neg liver ROS, GERD-  ,  Endo/Other  Morbid obesity  Renal/GU negative Renal ROS  negative genitourinary   Musculoskeletal negative musculoskeletal ROS (+)   Abdominal (+) + obese,   Peds negative pediatric ROS (+)  Hematology negative hematology ROS (+)   Anesthesia Other Findings   Reproductive/Obstetrics negative OB ROS                          Anesthesia Physical Anesthesia Plan  ASA: III  Anesthesia Plan: General   Post-op Pain Management:    Induction: Intravenous  Airway Management Planned: Oral ETT  Additional Equipment:   Intra-op Plan:   Post-operative Plan: Extubation in OR  Informed Consent: I have reviewed the patients History and Physical, chart, labs and discussed the procedure including the risks, benefits and alternatives for the proposed anesthesia with the patient or authorized representative who has indicated his/her understanding and acceptance.   Dental advisory given  Plan Discussed with: CRNA  Anesthesia Plan Comments:         Anesthesia Quick Evaluation

## 2012-03-29 NOTE — Preoperative (Signed)
Beta Blockers   Reason not to administer Beta Blockers:Not Applicable 

## 2012-03-30 ENCOUNTER — Encounter (HOSPITAL_COMMUNITY): Payer: Self-pay | Admitting: General Surgery

## 2012-03-30 DIAGNOSIS — K8000 Calculus of gallbladder with acute cholecystitis without obstruction: Principal | ICD-10-CM

## 2012-03-30 DIAGNOSIS — F32A Depression, unspecified: Secondary | ICD-10-CM

## 2012-03-30 DIAGNOSIS — F329 Major depressive disorder, single episode, unspecified: Secondary | ICD-10-CM | POA: Diagnosis present

## 2012-03-30 DIAGNOSIS — M5430 Sciatica, unspecified side: Secondary | ICD-10-CM

## 2012-03-30 HISTORY — DX: Morbid (severe) obesity due to excess calories: E66.01

## 2012-03-30 HISTORY — DX: Depression, unspecified: F32.A

## 2012-03-30 HISTORY — DX: Calculus of gallbladder with acute cholecystitis without obstruction: K80.00

## 2012-03-30 HISTORY — DX: Sciatica, unspecified side: M54.30

## 2012-03-30 MED ORDER — ACETAMINOPHEN 325 MG PO TABS: ORAL_TABLET | ORAL | Status: DC

## 2012-03-30 MED ORDER — OXYCODONE-ACETAMINOPHEN 5-325 MG PO TABS: 1.0000 | ORAL_TABLET | ORAL | Status: DC | PRN

## 2012-03-30 NOTE — Progress Notes (Signed)
Patient discharged to home with family via wheelchair, discharge instructions reviewed with patient who verbalized understanding, new RX given to patient.

## 2012-03-30 NOTE — Progress Notes (Signed)
1 Day Post-Op  Subjective: Feeling better, taking clears, has been up some.  Objective: Vital signs in last 24 hours: Temp:  [97.9 F (36.6 C)-98.8 F (37.1 C)] 98.2 F (36.8 C) (02/13 0525) Pulse Rate:  [68-99] 84 (02/13 0525) Resp:  [16-20] 18 (02/13 0525) BP: (98-126)/(41-76) 114/67 mmHg (02/13 0525) SpO2:  [90 %-97 %] 97 % (02/13 0525) Last BM Date: 03/28/12  Diet: clears, afebrile, VSS, no labs  Intake/Output from previous day: 02/12 0701 - 02/13 0700 In: 3127.1 [I.V.:2927.1; IV Piggyback:200] Out: 1350 [Urine:1350] Intake/Output this shift:    General appearance: alert, cooperative and no distress GI: soft, tender post op, few bS, some flatus  Lab Results:   Recent Labs  03/28/12 1554 03/29/12 0455  WBC 12.3* 20.7*  HGB 13.7 12.0  HCT 42.0 37.8  PLT 413* 422*    BMET  Recent Labs  03/28/12 1554 03/29/12 0455  NA 137 135  K 4.2 4.0  CL 98 99  CO2 24 28  GLUCOSE 188* 183*  BUN 7 7  CREATININE 0.67 0.64  CALCIUM 9.2 8.1*   PT/INR No results found for this basename: LABPROT, INR,  in the last 72 hours   Recent Labs Lab 03/28/12 1554 03/29/12 0455  AST 30 22  ALT 31 27  ALKPHOS 98 76  BILITOT 0.2* 0.2*  PROT 8.2 6.5  ALBUMIN 3.9 3.0*     Lipase     Component Value Date/Time   LIPASE 25 03/28/2012 1554     Studies/Results: Dg Cholangiogram Operative  03/29/2012  *RADIOLOGY REPORT*  Clinical Data:   Cholelithiasis  INTRAOPERATIVE CHOLANGIOGRAM  Technique:  Cholangiographic images from the C-arm fluoroscopic device were submitted for interpretation post-operatively.  Please see the procedural report for the amount of contrast and the fluoroscopy time utilized.  Comparison:  None  Findings:  No persistent filling defects in the common duct. Intrahepatic ducts are incompletely visualized, appearing decompressed centrally. 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   IMPRESSION  Negative for retained common duct stone.   Original Report Authenticated By: D. Andria Rhein, MD    US Abdomen Complete  03/28/2012  *RADIOLOGY REPORT*  Clinical Data:  Abdominal pain with nausea and vomiting  ABDOMINAL ULTRASOUND COMPLETE  Comparison:  None.  Findings:  Gallbladder:  Multiple gallstones are present.  The largest individual stone visualized measures 1.7 cm in size.  Gallbladder wall thickness remains within normal limits (2.8 mm).  The sonographic Murphy's sign is negative.  Common Bile Duct:  Within normal limits in caliber. Measures 4.9 mm.  Liver: The echogenicity of the liver is increased and heterogeneous.  This suggests fatty infiltration of the liver.  At least two hypoechoic lesions are noted in the liver, the largest measuring 1.3 x 1.5 x 1.6 cm.  The smaller lesion measures 1.8 x 1.3 x 1.0 cm.  These are not definitively characterized by ultrasound.  IVC:  Appears normal.  Pancreas:  Although the pancreas is difficult to visualize in its entirety, no focal pancreatic abnormality is identified.  Spleen:  Within normal limits in size and echotexture. Measures 7.9 cm in sagittal length.  Right kidney:  Normal in size and parenchymal echogenicity.  No evidence of mass or hydronephrosis.  Left kidney:  Normal in size and parenchymal echogenicity.  No evidence of mass or hydronephrosis.  Abdominal Aorta:  No aneurysm identified. Distal portion of the abdominal aorta is not well visualized due to bowel  gas.  IMPRESSION:  1.  Cholelithiasis.  The 2.  Two small hypoechoic lesions in the liver.  No prior abdominal imaging is available for comparison.  Further evaluation with CT of the abdomen with contrast could be considered. 3.  Fatty infiltration of the liver.   Original Report Authenticated By: Britta Mccreedy, M.D.     Medications: . ampicillin-sulbactam (UNASYN) IV  3 g Intravenous Q6H  . enoxaparin (LOVENOX) injection  40 mg Subcutaneous Q24H  .  triamterene-hydrochlorothiazide  1 each Oral Daily    Assessment/Plan Calculus of gallbladder with acute cholecystitis, without mention of obstruction, s/p Laparoscopic Cholecystectomy with IOC, 03/29/2012, Thomas A. Cornett, MD. Hypertension  Depression  Sciatica  Body mass index is 40.9   Plan:  Advance diet as tolerated.  Home when she can tolerate regular diet and ambulate without difficulty.     LOS: 2 days    Yvonne Mckee 03/30/2012

## 2012-03-30 NOTE — Discharge Summary (Signed)
Looks well Home today

## 2012-03-30 NOTE — Progress Notes (Signed)
Maybe home later

## 2012-03-30 NOTE — Discharge Summary (Signed)
Physician Discharge Summary  Patient ID: Yvonne Mckee MRN: [id number] DOB/AGE: 47/16/1967 47 y.o.  Admit date: 03/28/2012 Discharge date: 03/30/2012  Admission Diagnoses: Cholelithiasis with cholecystitis Hypertension  Depression  Sciatica  Body mass index is 40.9  Discharge Diagnoses:  Principal Problem:   Calculus of gallbladder with acute cholecystitis, without mention of obstruction Active Problems:   Depression   Sciatica   Severe obesity (BMI >= 40)   PROCEDURES: Laparoscopic Cholecystectomy with IOC , 03/29/2012, Thomas A. Cornett, MD     Hospital Course: was asked to evaluate this patient for epigastric and right upper quadrant abdominal pain which started acutely this morning at about 7:30, about one hour after rising. She said that this started suddenly and has been intense pain which she describes as a "deep" pain located in the epigastric and right upper quadrant regions her abdomen with some associated back pain. She tried to eat some eggs and sausage today to an she vomited this back up. She has vomited about 3 times as well with the last episode being more greenish. She did have a bowel movement today without any improvement. She feels some chills but no fevers. She has had some improvement with the pain medicine given in the emergency room but the pain only lets off for a while and then comes back. She denies any reflux or heartburn symptoms and denies any significant NSAID, steroid, or aspirin use. She denies any prior episodes of discomfort similar to this. She does have a white blood cell count of 12 an ultrasound which demonstrates multiple gallstones but no evidence of cholecystitis.  She was admitted by Dr. Biagio Quint and the following AM her WBC was up more. She still had significant discomfort.  She was taken to the OR that day by Dr. Luisa Hart.  And underwent above noted procedure.  She has done well post op and is tolerating a regular diet.  Walking and has had a  BM.  She is ready to go home .  She will follow up with DR. Cornett or the [facility] in 2 weeks.  Condition on D/C:  Improved.    Disposition: 01-Home or Self Care     Medication List    TAKE these medications       acetaminophen 325 MG tablet  Commonly known as:  TYLENOL  Do not take more than 4000 mg of tylenol (actaminophen) in 24 hours.     cyclobenzaprine 10 MG tablet  Commonly known as:  FLEXERIL  Take 10 mg by mouth 3 (three) times daily as needed for muscle spasms.     FLUoxetine 40 MG capsule  Commonly known as:  PROZAC  Take 40 mg by mouth at bedtime.     oxyCODONE-acetaminophen 5-325 MG per tablet  Commonly known as:  PERCOCET/ROXICET  Take 1-2 tablets by mouth every 4 (four) hours as needed.     triamterene-hydrochlorothiazide 37.5-25 MG per tablet  Commonly known as:  MAXZIDE-25  Take 1 tablet by mouth daily.       Follow-up Information   Follow up with CORNETT,THOMAS A., MD. Schedule an appointment as soon as possible for a visit in 2 weeks. (Make an appointment with the [facility] or Dr. Luisa Hart 2-3 weeks.)    Contact information:   [address] [place] [address] [phone]       Call METZ,CHRISTINE. (For any medical issues and let her know you had surgery.)    Contact information:   [address] [place] [address] [phone]       Signed: Sherrie George 03/30/2012, 1:58 PM

## 2012-03-31 ENCOUNTER — Encounter (HOSPITAL_COMMUNITY): Payer: Self-pay | Admitting: Surgery

## 2012-04-14 ENCOUNTER — Encounter (INDEPENDENT_AMBULATORY_CARE_PROVIDER_SITE_OTHER): Payer: Self-pay | Admitting: Surgery

## 2012-04-14 ENCOUNTER — Ambulatory Visit (INDEPENDENT_AMBULATORY_CARE_PROVIDER_SITE_OTHER): Payer: Self-pay | Admitting: Surgery

## 2012-04-14 VITALS — BP 146/84 | HR 114 | Temp 97.4°F | Resp 16 | Ht 63.0 in | Wt 212.4 lb

## 2012-04-14 DIAGNOSIS — Z9889 Other specified postprocedural states: Secondary | ICD-10-CM

## 2012-04-14 NOTE — Progress Notes (Signed)
she is here for a postop visit following laparoscopic cholecystectomy.  Diet is being tolerated, bowels are moving.  No problems with incisions. Some heartburn she thinks is from prozac.  PE:  ABD:  Soft, incisions clean/dry/intact and solid.  Assessment:  Doing well postop.  Plan:  Lowfat diet recommended.  Activities as tolerated.  Return visit prn. Cut back caffiene intake and fat intake and change antidepressant to see if this helps.  May need GI work up for GERD.

## 2012-04-14 NOTE — Patient Instructions (Signed)
Try to change antidepressant med for reflux. If it does not help,  Cut back on fat intake and caffiene intake.

## 2012-04-24 ENCOUNTER — Other Ambulatory Visit (HOSPITAL_COMMUNITY): Payer: Self-pay | Admitting: *Deleted

## 2012-04-24 DIAGNOSIS — Z139 Encounter for screening, unspecified: Secondary | ICD-10-CM

## 2012-04-27 ENCOUNTER — Ambulatory Visit (HOSPITAL_COMMUNITY)
Admission: RE | Admit: 2012-04-27 | Discharge: 2012-04-27 | Disposition: A | Discharge status: Home / Self Care | Payer: Self-pay | Source: Ambulatory Visit | Attending: *Deleted | Admitting: *Deleted

## 2012-04-27 DIAGNOSIS — Z139 Encounter for screening, unspecified: Secondary | ICD-10-CM

## 2012-05-01 ENCOUNTER — Other Ambulatory Visit: Payer: Self-pay | Admitting: *Deleted

## 2012-05-01 DIAGNOSIS — R928 Other abnormal and inconclusive findings on diagnostic imaging of breast: Secondary | ICD-10-CM

## 2012-05-24 ENCOUNTER — Encounter (HOSPITAL_COMMUNITY): Payer: Self-pay

## 2012-05-24 ENCOUNTER — Ambulatory Visit (HOSPITAL_COMMUNITY)
Admission: RE | Admit: 2012-05-24 | Discharge: 2012-05-24 | Disposition: A | Discharge status: Home / Self Care | Payer: Self-pay | Source: Ambulatory Visit | Attending: *Deleted | Admitting: *Deleted

## 2012-05-24 DIAGNOSIS — R928 Other abnormal and inconclusive findings on diagnostic imaging of breast: Secondary | ICD-10-CM | POA: Insufficient documentation

## 2013-06-12 ENCOUNTER — Other Ambulatory Visit (HOSPITAL_COMMUNITY): Payer: Self-pay | Admitting: *Deleted

## 2013-08-27 IMAGING — CR DG LUMBAR SPINE COMPLETE 4+V
5 series · 5 of 5 positions shown · non-contrast
Comparison: None.

CLINICAL DATA: Fall, low back pain

LUMBAR SPINE - COMPLETE 4+ VIEW

[view not recorded (1 of 5)]
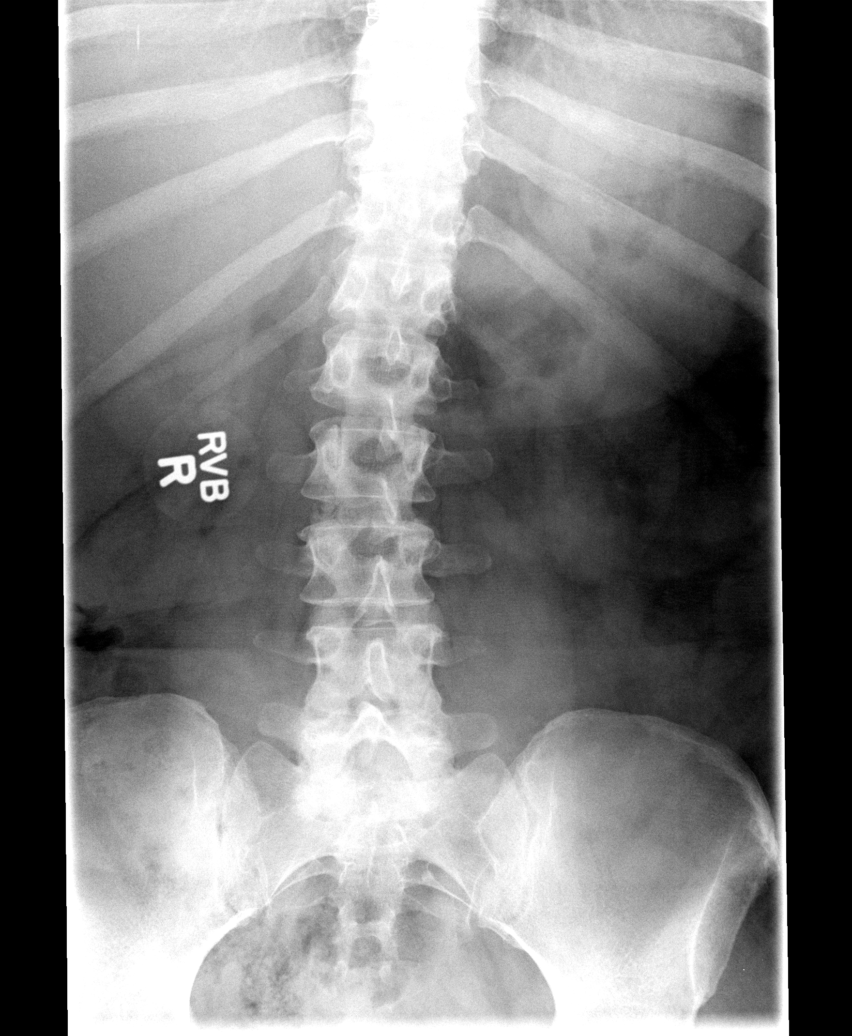

[view not recorded (2 of 5)]
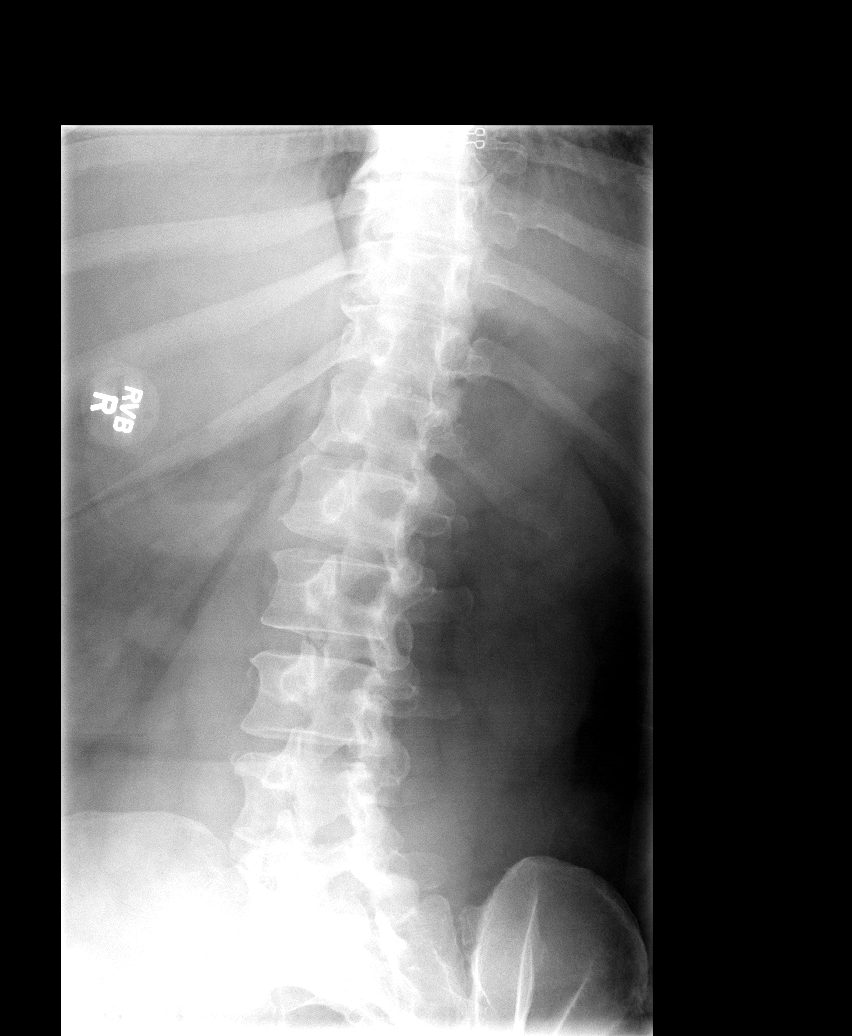

[view not recorded (3 of 5)]
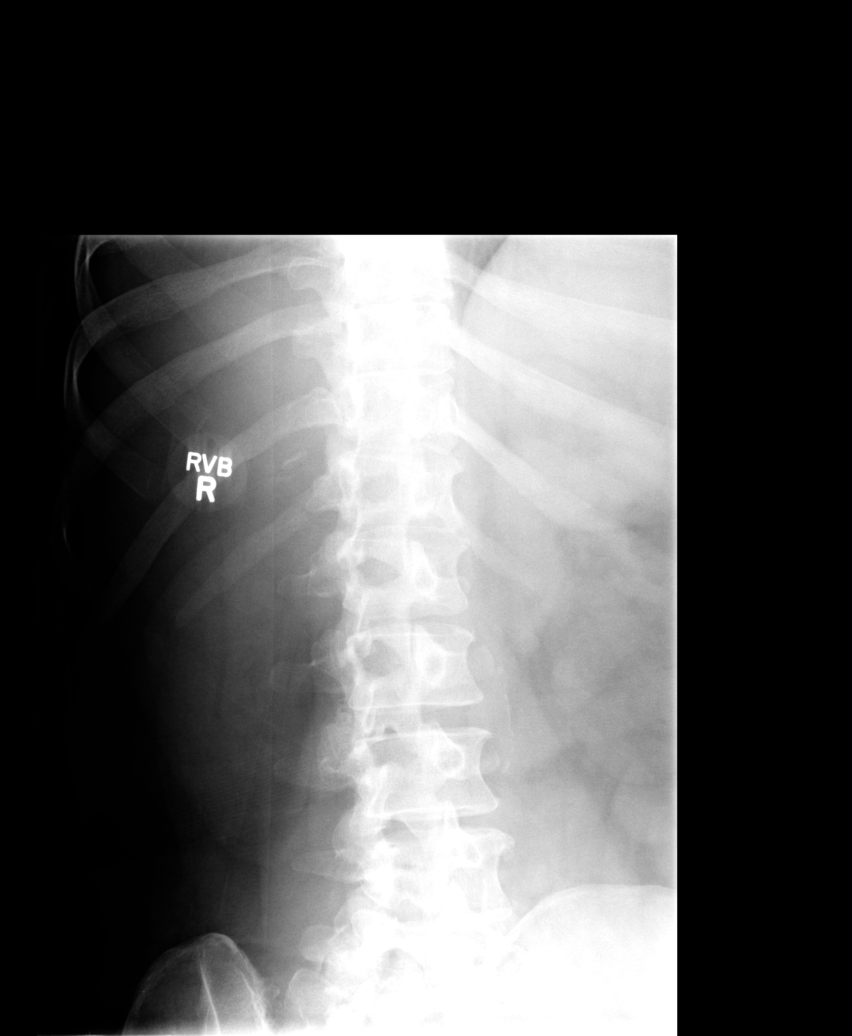

[view not recorded (4 of 5)]
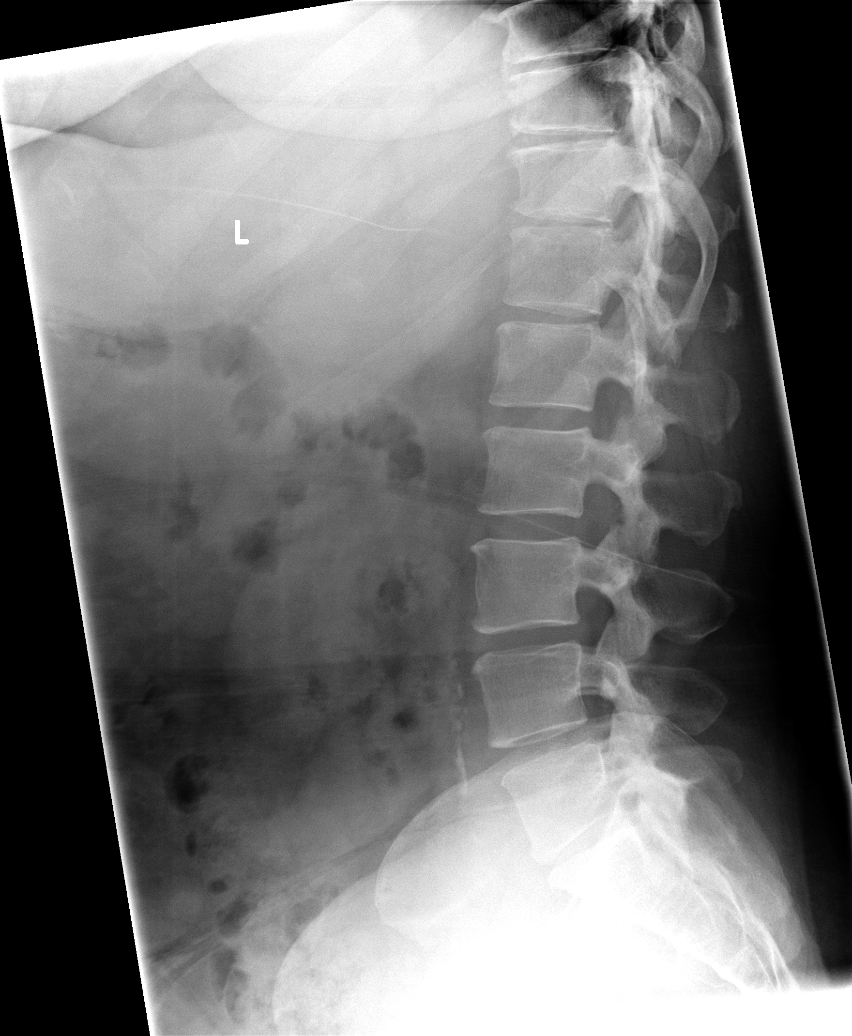

[view not recorded (5 of 5)]
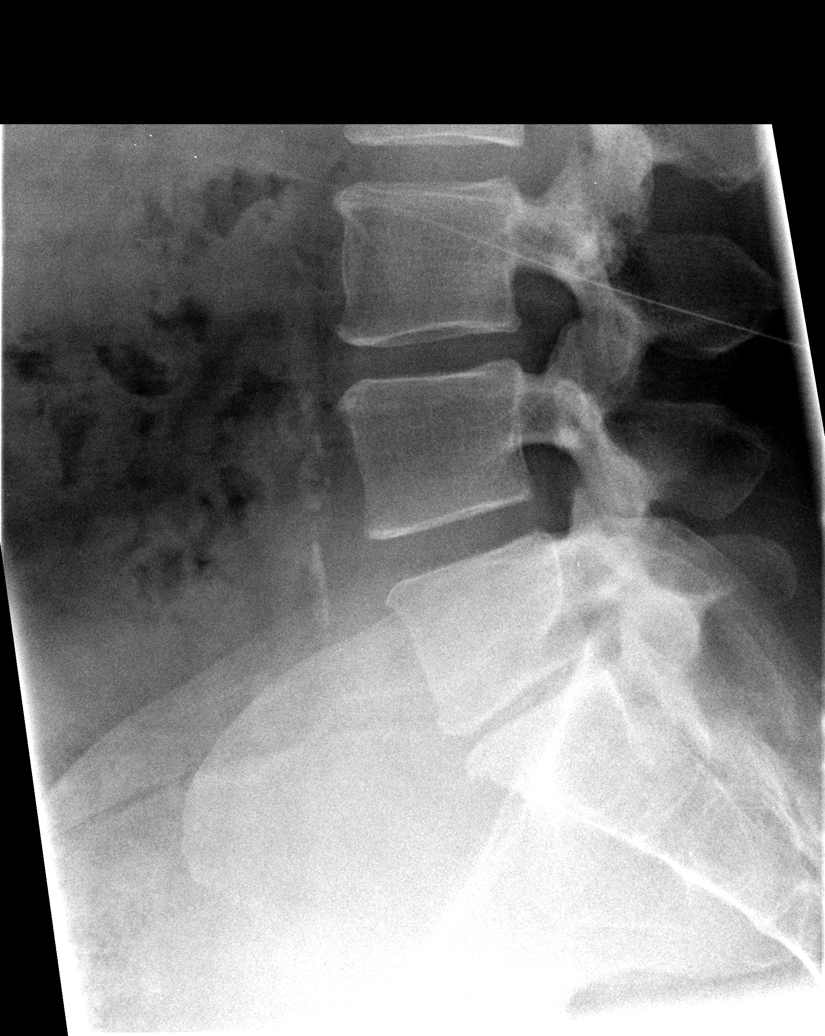

[5 of 5 positions shown; findings below may reference images not displayed]

FINDINGS: Five lumbar-type vertebral bodies.

Normal lumbar lordosis.

No evidence of fracture or dislocation.  The body heights are
maintained.

Mild multilevel degenerative changes.

Mild superior endplate changes at T9 and T10, likely chronic.

Vascular calcifications.
IMPRESSION: No fracture or dislocation is seen.

Mild multilevel degenerative changes.

## 2014-03-02 IMAGING — RF DG CHOLANGIOGRAM OPERATIVE
1 series · 4 of 4 positions shown · non-contrast
Comparison: None

CLINICAL DATA: Cholelithiasis

INTRAOPERATIVE CHOLANGIOGRAM
TECHNIQUE: Cholangiographic images from the C-arm fluoroscopic
device were submitted for interpretation post-operatively.  Please
see the procedural report for the amount of contrast and the
fluoroscopy time utilized.

[Series 1: run · 4 of 89 frames shown]
[frame 14/89]
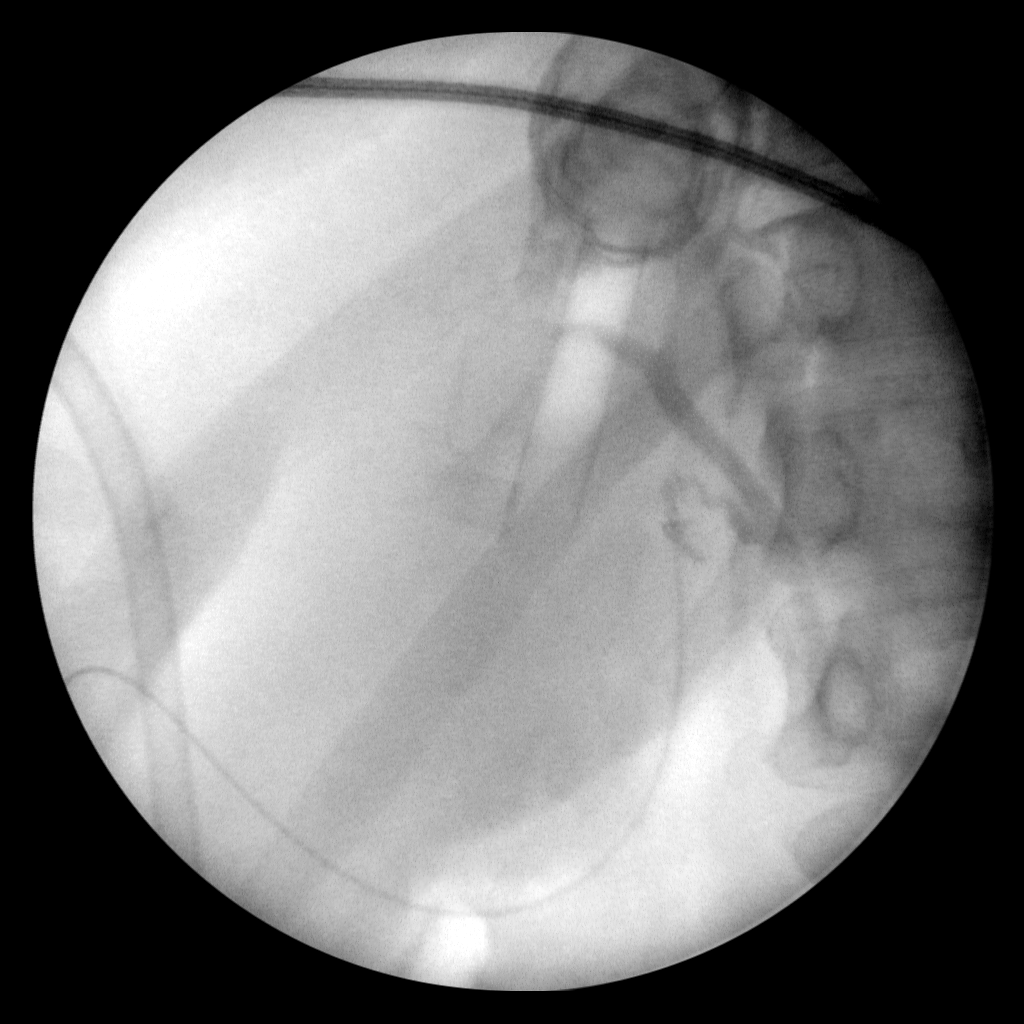
[frame 45/89]
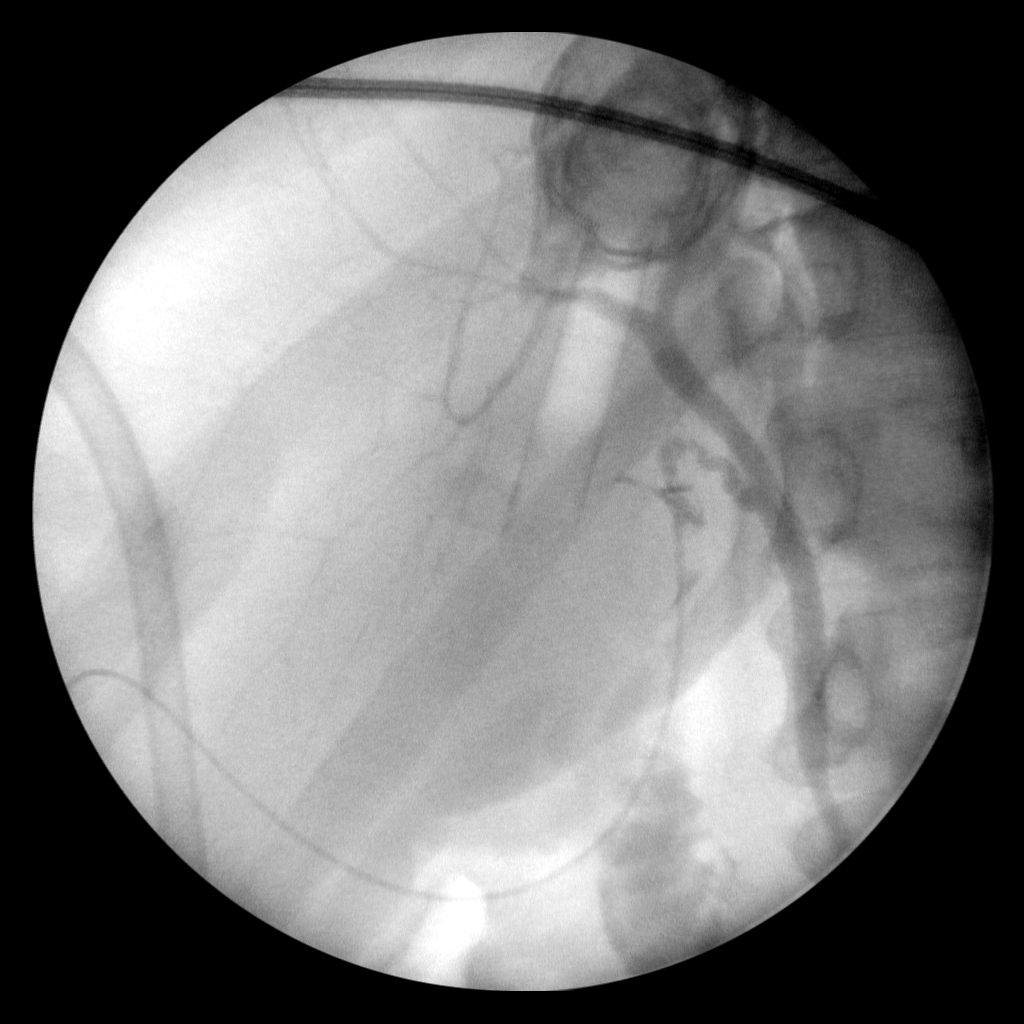
[frame 61/89]
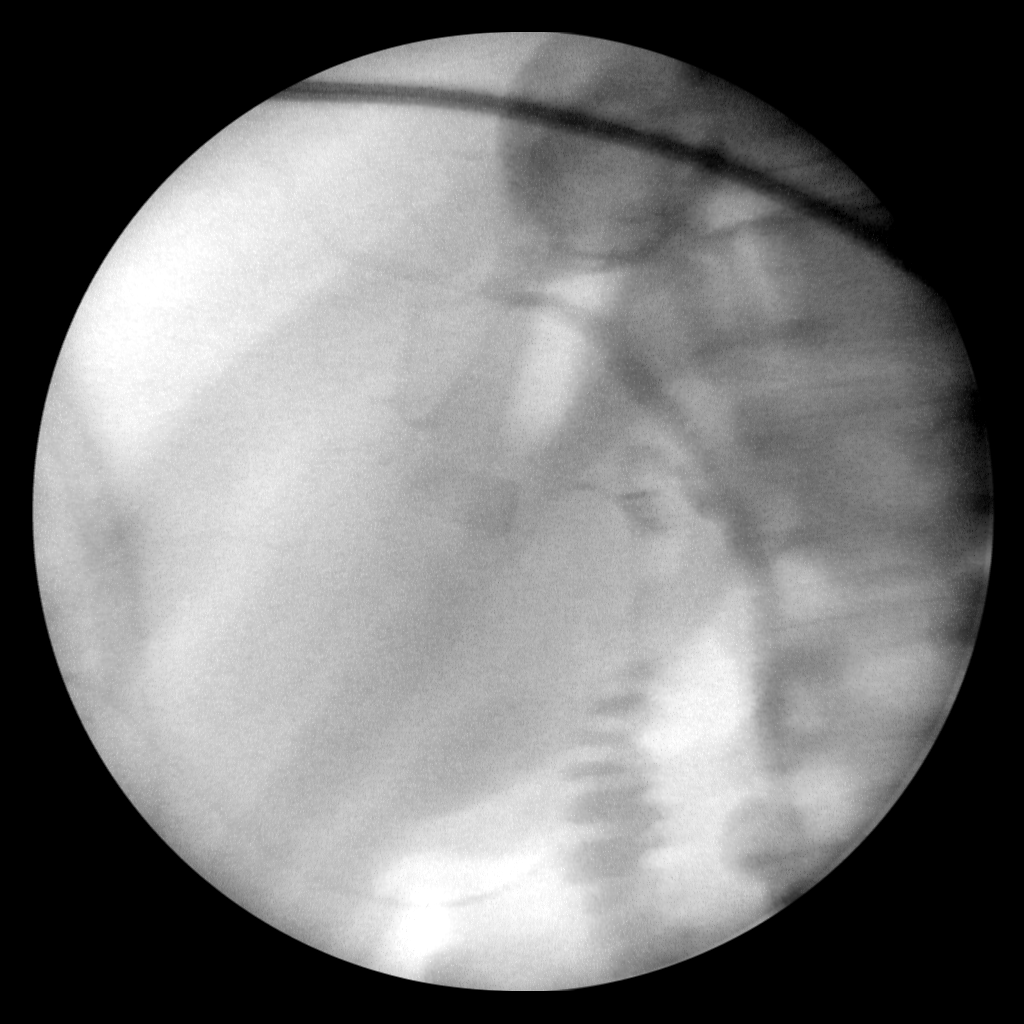
[frame 76/89]
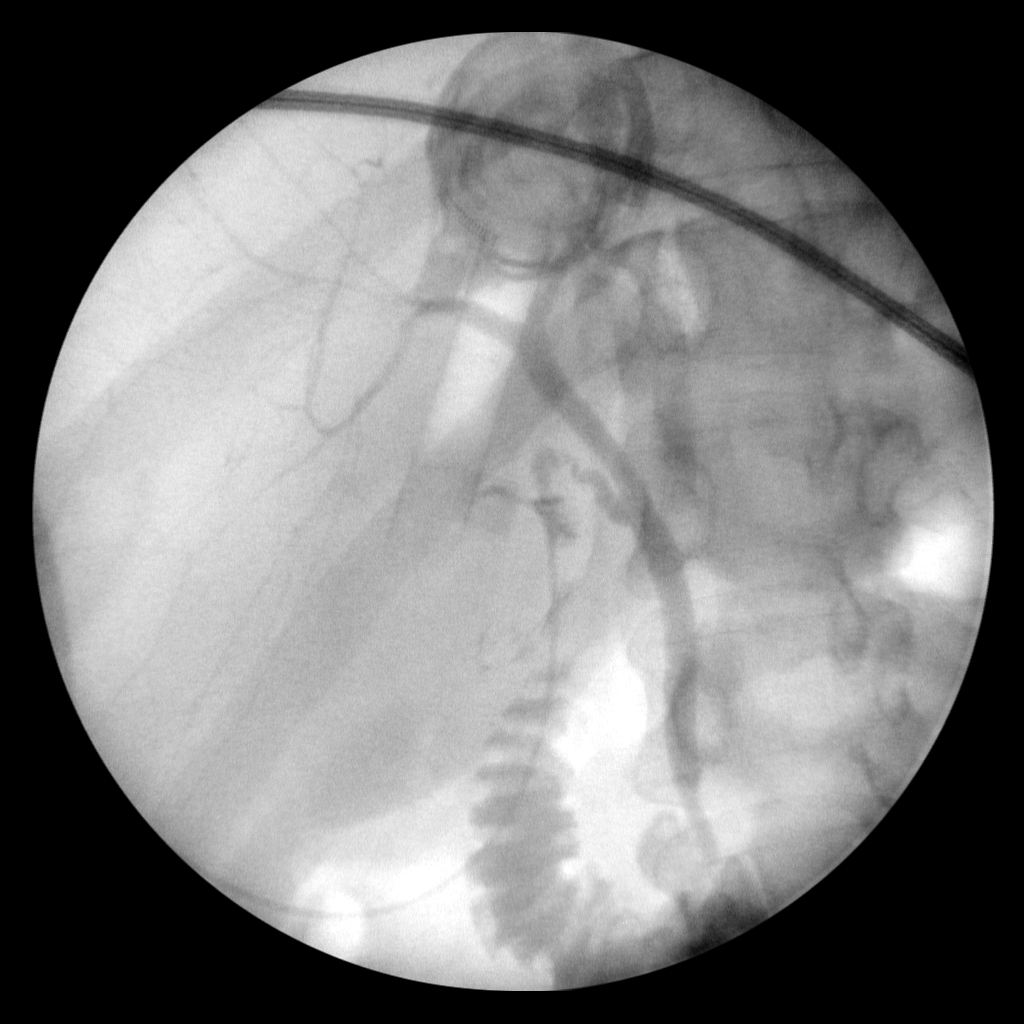

[4 of 4 positions shown; findings below may reference images not displayed]

FINDINGS: No persistent filling defects in the common duct.
Intrahepatic ducts are incompletely visualized, appearing
decompressed centrally. Contrast passes into the duodenum, with
suggestion of a small duodenal diverticulum at the level of the
ampulla..

IMPRESSION

Negative for retained common duct stone.

## 2016-01-19 ENCOUNTER — Emergency Department (HOSPITAL_COMMUNITY)
Admission: EM | Admit: 2016-01-19 | Discharge: 2016-01-19 | Disposition: A | Discharge status: Home / Self Care | Payer: Self-pay | Attending: Emergency Medicine | Admitting: Emergency Medicine

## 2016-01-19 ENCOUNTER — Emergency Department (HOSPITAL_COMMUNITY): Payer: Self-pay

## 2016-01-19 ENCOUNTER — Encounter (HOSPITAL_COMMUNITY): Payer: Self-pay | Admitting: Emergency Medicine

## 2016-01-19 DIAGNOSIS — E119 Type 2 diabetes mellitus without complications: Secondary | ICD-10-CM | POA: Insufficient documentation

## 2016-01-19 DIAGNOSIS — I10 Essential (primary) hypertension: Secondary | ICD-10-CM | POA: Insufficient documentation

## 2016-01-19 DIAGNOSIS — K219 Gastro-esophageal reflux disease without esophagitis: Secondary | ICD-10-CM | POA: Insufficient documentation

## 2016-01-19 DIAGNOSIS — Z79899 Other long term (current) drug therapy: Secondary | ICD-10-CM | POA: Insufficient documentation

## 2016-01-19 HISTORY — DX: Type 2 diabetes mellitus without complications: E11.9

## 2016-01-19 LAB — BASIC METABOLIC PANEL
ANION GAP: 7 (ref 5–15)
BUN: 13 mg/dL (ref 6–20)
CALCIUM: 9.1 mg/dL (ref 8.9–10.3)
CO2: 26 mmol/L (ref 22–32)
Chloride: 103 mmol/L (ref 101–111)
Creatinine, Ser: 0.65 mg/dL (ref 0.44–1.00)
GFR calc Af Amer: >60 mL/min (ref >60)
GLUCOSE: 169 mg/dL — AB (ref 65–99)
Potassium: 3.7 mmol/L (ref 3.5–5.1)
Sodium: 136 mmol/L (ref 135–145)

## 2016-01-19 LAB — CBC
HCT: 33.7 % — ABNORMAL LOW (ref 36.0–46.0)
HEMOGLOBIN: 10.3 g/dL — AB (ref 12.0–15.0)
MCH: 24.7 pg — ABNORMAL LOW (ref 26.0–34.0)
MCHC: 30.6 g/dL (ref 30.0–36.0)
MCV: 80.8 fL (ref 78.0–100.0)
PLATELETS: 469 10*3/uL — AB (ref 150–400)
RBC: 4.17 MIL/uL (ref 3.87–5.11)
RDW: 13.8 % (ref 11.5–15.5)
WBC: 9.7 10*3/uL (ref 4.0–10.5)

## 2016-01-19 LAB — URINALYSIS, ROUTINE W REFLEX MICROSCOPIC
BILIRUBIN URINE: NEGATIVE
Glucose, UA: NEGATIVE mg/dL
Ketones, ur: NEGATIVE mg/dL
Leukocytes, UA: NEGATIVE
Nitrite: NEGATIVE
PROTEIN: NEGATIVE mg/dL
pH: 6.5 (ref 5.0–8.0)

## 2016-01-19 LAB — HEPATIC FUNCTION PANEL
ALT: 36 U/L (ref 14–54)
AST: 62 U/L — AB (ref 15–41)
Albumin: 3.7 g/dL (ref 3.5–5.0)
Alkaline Phosphatase: 74 U/L (ref 38–126)
Total Bilirubin: 0.1 mg/dL — ABNORMAL LOW (ref 0.3–1.2)
Total Protein: 7.1 g/dL (ref 6.5–8.1)

## 2016-01-19 LAB — TROPONIN I

## 2016-01-19 LAB — URINE MICROSCOPIC-ADD ON

## 2016-01-19 LAB — CBG MONITORING, ED: GLUCOSE-CAPILLARY: 162 mg/dL — AB (ref 65–99)

## 2016-01-19 LAB — LIPASE, BLOOD: Lipase: 31 U/L (ref 11–51)

## 2016-01-19 MED ORDER — GI COCKTAIL ~~LOC~~
30.0000 mL | Freq: Once | ORAL | Status: AC
  Administered 2016-01-19: 30 mL via ORAL
  Filled 2016-01-19: qty 30

## 2016-01-19 NOTE — Discharge Instructions (Signed)
Workup without any acute findings. Consider repeat using liquid Maalox if symptoms recur. Can continue your current medications. Return for any new or worse symptoms.

## 2016-01-19 NOTE — ED Provider Notes (Signed)
[facility] DEPT Provider Note   CSN: [id number] Arrival date & time: 01/19/16  1613 By signing my name below, I, Dyke Brackett, attest that this documentation has been prepared under the direction and in the presence of Fredia Sorrow, MD . Electronically Signed: Dyke Brackett, Scribe. 01/19/2016. 9:15 PM.   History   Chief Complaint Chief Complaint  Patient presents with  . Chest Pain   HPI Yvonne Mckee is a 50 y.o. female with a hx of DM and HTN who presents to the Emergency Department complaining of constant, lower substernal chest pain that does not radiate onset yesterday. She describes her pain as burning and rates it 7/10 in severity. Pt has taken Zantac and generic Protonix with no relief. No alleviating or modifying factors noted. Per pt, this feels similar to pain she had prior to cholecystectomy. She notes associated SOB and some ankle swelling. Pt denies nausea, vomiting, abdominal pain, fever, chills, visual disturbance, cough, rhinorrhea, sore throat, dysuria, hematuria, back pain, or headaches. She is not on any blood thinners.   The history is provided by the patient. No language interpreter was used.   Past Medical History:  Diagnosis Date  . Anxiety   . Calculus of gallbladder with acute cholecystitis, without mention of obstruction 03/30/2012  . Depression   . Depression 03/30/2012  . Diabetes mellitus without complication ([place])   . Hypertension   . Sciatica   . Sciatica 03/30/2012  . Severe obesity (BMI >= 40) ([place]) 03/30/2012    Patient Active Problem List   Diagnosis Date Noted  . Calculus of gallbladder with acute cholecystitis, without mention of obstruction 03/30/2012  . Depression 03/30/2012  . Sciatica 03/30/2012  . Severe obesity (BMI >= 40) ([place]) 03/30/2012  . BACK PAIN, LUMBAR 07/24/2008  . OTITIS MEDIA, ACUTE 07/08/2008  . CELLULITIS, FINGER 07/08/2008  . OBESITY 06/17/2008  . UPPER RESPIRATORY INFECTION, VIRAL 05/14/2008  .  HYPERTENSION 06/07/2007  . FLUID RETENTION 06/02/2006  . ELEVATED BLOOD PRESSURE WITHOUT DIAGNOSIS OF HYPERTENSION 04/25/2006  . ANXIETY 04/22/2006  . DEPRESSION 04/22/2006  . MIGRAINE HEADACHE 04/22/2006  . GERD 04/22/2006    Past Surgical History:  Procedure Laterality Date  . CESAREAN SECTION     x 2  . CHOLECYSTECTOMY N/A 03/29/2012   Procedure: LAPAROSCOPIC CHOLECYSTECTOMY WITH INTRAOPERATIVE CHOLANGIOGRAM;  Surgeon: Joyice Faster. Cornett, MD;  Location: [facility];  Service: General;  Laterality: N/A;  . TONSILLECTOMY    . TUBAL LIGATION      OB History    No data available       Home Medications    Prior to Admission medications   Medication Sig Start Date End Date Taking? Authorizing Provider  cyclobenzaprine (FLEXERIL) 10 MG tablet Take 10 mg by mouth 3 (three) times daily as needed for muscle spasms.   Yes Historical Provider, MD  FLUoxetine (PROZAC) 40 MG capsule Take 40 mg by mouth at bedtime.    Yes Historical Provider, MD  hydrochlorothiazide (HYDRODIURIL) 25 MG tablet Take 25 mg by mouth daily.   Yes Historical Provider, MD  ranitidine (ZANTAC) 150 MG tablet Take 150 mg by mouth 2 (two) times daily.   Yes Historical Provider, MD    Family History Family History  Problem Relation Age of Onset  . Asthma Daughter     Social History Social History  Substance Use Topics  . Smoking status: Never Smoker  . Smokeless tobacco: Never Used  . Alcohol use Yes     Comment: occasionally    Allergies  Bupropion hcl; Squid oil; and St johns wort  Review of Systems Review of Systems  Constitutional: Negative for chills and fever.  HENT: Negative for rhinorrhea and sore throat.   Eyes: Negative for visual disturbance.  Respiratory: Positive for shortness of breath. Negative for cough.   Cardiovascular: Positive for chest pain.  Gastrointestinal: Negative for abdominal pain, diarrhea, nausea and vomiting.  Genitourinary: Negative for dysuria and hematuria.    Musculoskeletal: Positive for joint swelling. Negative for back pain.  Skin: Negative for rash.  Neurological: Negative for headaches.  Hematological: Does not bruise/bleed easily.  Psychiatric/Behavioral: Negative for confusion.   Physical Exam Updated Vital Signs BP 140/74 (BP Location: Right Arm)   Pulse 68   Temp 98.1 F (36.7 C) (Oral)   Resp 20   Ht 5\' 2"  (1.575 m)   Wt 92.5 kg   LMP 01/10/2016   SpO2 98%   BMI 37.31 kg/m   Physical Exam  Constitutional: She is oriented to person, place, and time. She appears well-developed and well-nourished. No distress.  HENT:  Head: Normocephalic and atraumatic.  Moist mucus membranes  Eyes: Conjunctivae and EOM are normal. Pupils are equal, round, and reactive to light. No scleral icterus.  Cardiovascular: Normal rate and regular rhythm.   Pulmonary/Chest: Effort normal. She has no wheezes. She has no rales. She exhibits no tenderness.  Lungs clear bilaterally  Abdominal: Bowel sounds are normal. She exhibits no distension. There is no tenderness.  Musculoskeletal: She exhibits no edema.  No pitting edema  Neurological: She is alert and oriented to person, place, and time. No cranial nerve deficit or sensory deficit. She exhibits normal muscle tone. Coordination normal.  Skin: Skin is warm and dry.  Psychiatric: She has a normal mood and affect.  Nursing note and vitals reviewed.   ED Treatments / Results  DIAGNOSTIC STUDIES:  Oxygen Saturation is 100% on RA, normal by my interpretation.    COORDINATION OF CARE:  9:12 PM Discussed treatment plan with pt at bedside and pt agreed to plan.  Labs (all labs ordered are listed, but only abnormal results are displayed) Labs Reviewed  BASIC METABOLIC PANEL - Abnormal; Notable for the following:       Result Value   Glucose, Bld 169 (*)    All other components within normal limits  CBC - Abnormal; Notable for the following:    Hemoglobin 10.3 (*)    HCT 33.7 (*)    MCH 24.7  (*)    Platelets 469 (*)    All other components within normal limits  URINALYSIS, ROUTINE W REFLEX MICROSCOPIC (NOT AT [facility]) - Abnormal; Notable for the following:    Specific Gravity, Urine <1.005 (*)    Hgb urine dipstick MODERATE (*)    All other components within normal limits  URINE MICROSCOPIC-ADD ON - Abnormal; Notable for the following:    Squamous Epithelial / LPF 0-5 (*)    Bacteria, UA RARE (*)    All other components within normal limits  HEPATIC FUNCTION PANEL - Abnormal; Notable for the following:    AST 62 (*)    Total Bilirubin 0.1 (*)    Bilirubin, Direct <0.1 (*)    All other components within normal limits  CBG MONITORING, ED - Abnormal; Notable for the following:    Glucose-Capillary 162 (*)    All other components within normal limits  TROPONIN I  LIPASE, BLOOD    EKG  EKG Interpretation  Date/Time:  Monday January 19 2016 16:41:13 EST  Ventricular Rate:  82 PR Interval:  148 QRS Duration: 80 QT Interval:  398 QTC Calculation: 464 R Axis:   -56 Text Interpretation:  Normal sinus rhythm Left axis deviation Pulmonary disease pattern Abnormal ECG No significant change since last tracing Confirmed by Lyncoln Ledgerwood  MD, [place] Schnitker [phone]) on 01/19/2016 4:57:42 PM       Radiology Dg Chest 2 View  Result Date: 01/19/2016 CLINICAL DATA:  Indigestion.  Chest pain and heartburn. EXAM: CHEST  2 VIEW COMPARISON:  None. FINDINGS: The heart size and mediastinal contours are within normal limits. Both lungs are clear. The visualized skeletal structures are unremarkable. IMPRESSION: Negative two view chest x-ray Electronically Signed   By: San Morelle M.D.   On: 01/19/2016 17:08    Procedures Procedures (including critical care time)  Medications Ordered in ED Medications  gi cocktail (Maalox,Lidocaine,Donnatal) (30 mLs Oral Given 01/19/16 2122)     Initial Impression / Assessment and Plan / ED Course  I have reviewed the triage vital signs and the nursing  notes.  Pertinent labs & imaging results that were available during my care of the patient were reviewed by me and considered in my medical decision making (see chart for details).  Clinical Course    Patient with complete relief of symptoms with GI cocktail. Seems to be consistent with esophagitis. Patient already on Zantac by mouth. Recommend using Maalox as needed. Patient will return for any new or worse symptoms. Troponin was negative liver function tests were negative lipase was negative.  Final Clinical Impressions(s) / ED Diagnoses   Final diagnoses:  Gastroesophageal reflux disease, esophagitis presence not specified    New Prescriptions New Prescriptions   No medications on file  I personally performed the services described in this documentation, which was scribed in my presence. The recorded information has been reviewed and is accurate.      Fredia Sorrow, MD 01/19/16 [phone]

## 2016-01-19 NOTE — ED Notes (Signed)
Pt given ice chips and sips of water. Okay per Dr. Rogene Houston. Pt states relief from GI cocktail.

## 2016-01-19 NOTE — ED Triage Notes (Signed)
Patient complaining of "indigestion" burning pain to center of chest since yesterday. States she has taken OTC medications with no relief.

## 2016-09-30 ENCOUNTER — Other Ambulatory Visit (HOSPITAL_COMMUNITY): Payer: Self-pay | Admitting: *Deleted

## 2016-09-30 DIAGNOSIS — Z1231 Encounter for screening mammogram for malignant neoplasm of breast: Secondary | ICD-10-CM

## 2016-10-08 ENCOUNTER — Encounter (HOSPITAL_COMMUNITY): Payer: Self-pay

## 2016-10-08 ENCOUNTER — Ambulatory Visit (HOSPITAL_COMMUNITY): Payer: Self-pay

## 2016-10-15 ENCOUNTER — Other Ambulatory Visit (HOSPITAL_COMMUNITY): Payer: Self-pay | Admitting: *Deleted

## 2016-10-15 DIAGNOSIS — M7989 Other specified soft tissue disorders: Secondary | ICD-10-CM

## 2016-10-19 ENCOUNTER — Ambulatory Visit (HOSPITAL_COMMUNITY)
Admission: RE | Admit: 2016-10-19 | Discharge: 2016-10-19 | Disposition: A | Discharge status: Home / Self Care | Payer: Self-pay | Source: Ambulatory Visit | Attending: *Deleted | Admitting: *Deleted

## 2016-10-19 DIAGNOSIS — M799 Soft tissue disorder, unspecified: Secondary | ICD-10-CM | POA: Insufficient documentation

## 2016-10-19 DIAGNOSIS — M7989 Other specified soft tissue disorders: Secondary | ICD-10-CM

## 2016-10-20 ENCOUNTER — Other Ambulatory Visit (HOSPITAL_COMMUNITY): Payer: Self-pay | Admitting: *Deleted

## 2016-10-20 DIAGNOSIS — M7989 Other specified soft tissue disorders: Secondary | ICD-10-CM

## 2016-10-22 ENCOUNTER — Encounter: Payer: Self-pay | Admitting: *Deleted

## 2016-10-25 ENCOUNTER — Ambulatory Visit (HOSPITAL_COMMUNITY)
Admission: RE | Admit: 2016-10-25 | Discharge: 2016-10-25 | Disposition: A | Discharge status: Home / Self Care | Payer: Self-pay | Source: Ambulatory Visit | Attending: *Deleted | Admitting: *Deleted

## 2016-10-25 DIAGNOSIS — M7989 Other specified soft tissue disorders: Secondary | ICD-10-CM

## 2016-10-25 DIAGNOSIS — R221 Localized swelling, mass and lump, neck: Secondary | ICD-10-CM | POA: Insufficient documentation

## 2016-11-02 ENCOUNTER — Ambulatory Visit (INDEPENDENT_AMBULATORY_CARE_PROVIDER_SITE_OTHER): Payer: Self-pay | Admitting: Obstetrics & Gynecology

## 2016-11-02 ENCOUNTER — Encounter: Payer: Self-pay | Admitting: Obstetrics & Gynecology

## 2016-11-02 VITALS — BP 158/80 | HR 82 | Ht 62.75 in | Wt 197.0 lb

## 2016-11-02 DIAGNOSIS — N921 Excessive and frequent menstruation with irregular cycle: Secondary | ICD-10-CM

## 2016-11-02 DIAGNOSIS — N946 Dysmenorrhea, unspecified: Secondary | ICD-10-CM

## 2016-11-02 MED ORDER — MEGESTROL ACETATE 40 MG PO TABS: ORAL_TABLET | ORAL | 3 refills | Status: DC

## 2016-11-02 NOTE — Progress Notes (Signed)
Chief Complaint  Patient presents with  . having long periods    sometimes last 2 weeks    Blood pressure (!) 158/80, pulse 82, height 5' 2.75" (1.594 m), weight 197 lb (89.4 kg), last menstrual period 10/23/2016.  51 y.o. No obstetric history on file. Patient's last menstrual period was 10/23/2016 (exact date). The current method of family planning is tubal ligation.  Outpatient Encounter Prescriptions as of 11/02/2016  Medication Sig  . aspirin 81 MG chewable tablet Chew 81 mg by mouth daily.  . cyclobenzaprine (FLEXERIL) 10 MG tablet Take 10 mg by mouth daily.   . DiphenhydrAMINE HCl (BENADRYL PO) Take by mouth at bedtime.  . ferrous sulfate 325 (65 FE) MG tablet Take 325 mg by mouth daily.   Marland Kitchen FLUoxetine (PROZAC) 40 MG capsule Take 40 mg by mouth at bedtime.   . hydrochlorothiazide (HYDRODIURIL) 25 MG tablet Take 25 mg by mouth daily.  . lansoprazole (PREVACID) 15 MG capsule Take 15 mg by mouth daily at 12 noon.  . megestrol (MEGACE) 40 MG tablet 3 tablets a day for 5 days, 2 tablets a day for 5 days then 1 tablet daily  . [DISCONTINUED] ranitidine (ZANTAC) 150 MG tablet Take 150 mg by mouth 2 (two) times daily.   No facility-administered encounter medications on file as of 11/02/2016.     Subjective Yvonne Mckee 's referral from [facility] because of increasingly prolonged periods Patient states that about 5 or 6 years ago she was on birth control pills and her periods were good but then when she came off her pills a became about a week with the first 2 or 3 days being heavy and then trailing off overall for 7 days of bleeding Whoever for the past 3 months or so she is been having again the really heavy bleeding the first 2 or 3 days but then essentially 2 weeks of total bleeding variable volumes variable color sometimes red sometimes brown and dark Cramping is real bad the first to 3 days with the patient not taking ibuprofen which helps she does  not use a heating pad She does not have any pain with intercourse  Objective General WDWN female NAD Vulva:  normal appearing vulva with no masses, tenderness or lesions Vagina:  normal mucosa, no discharge, no lesions or discharge noted Cervix:  no cervical motion tenderness and no lesions Uterus:  Uterus felt a little generous maybe about 8-10 weeks size which is consistent with having 2 kids in the past but certainly no discrete fibroids were felt Adnexa: ovaries:,  Adnexa is without masses her ovaries were palpated bilaterally and found to be normal and nontender   Pertinent ROS No burning with urination, frequency or urgency No nausea, vomiting or diarrhea Nor fever chills or other constitutional symptoms   Labs or studies Hemoglobin 13.6    Impression Diagnoses this Encounter::   ICD-10-CM   1. Menometrorrhagia N92.1 US PELVIS (TRANSABDOMINAL ONLY)    US OB Transvaginal  2. Dysmenorrhea N94.6 US PELVIS (TRANSABDOMINAL ONLY)    US OB Transvaginal    Established relevant diagnosis(es):   Plan/Recommendations: Meds ordered this encounter  Medications  . lansoprazole (PREVACID) 15 MG capsule    Sig: Take 15 mg by mouth daily at 12 noon.  . DiphenhydrAMINE HCl (BENADRYL PO)    Sig: Take by mouth at bedtime.  . megestrol (MEGACE) 40 MG tablet    Sig: 3 tablets a day for 5 days, 2  tablets a day for 5 days then 1 tablet daily    Dispense:  45 tablet    Refill:  3    Labs or Scans Ordered: Orders Placed This Encounter  Procedures  . US PELVIS (TRANSABDOMINAL ONLY)  . US OB Transvaginal    Management:: We will place [place] 9 megestrol algorithm three-day 5 days 2 day 5 days and one a day I'll see her back in 1 month and we'll do an ultrasound to evaluate for endometrial pathology and see what her response to the megestrol has been It is hoped that she will have minimal to no bleeding and that we will find a easy management to this once we get her endometrium all  synchronized If in the meantime she hasn't difficulty she cannot give me a call  Follow up Return in about 1 month (around 12/02/2016) for GYN sono, Follow up, with Dr Elonda Husky.        All questions were answered.  Past Medical History:  Diagnosis Date  . Anxiety   . Calculus of gallbladder with acute cholecystitis, without mention of obstruction 03/30/2012  . Depression   . Depression 03/30/2012  . Diabetes mellitus without complication ([place])   . Hypertension   . Menometrorrhagia   . Sciatica   . Sciatica 03/30/2012  . Severe obesity (BMI >= 40) ([place]) 03/30/2012    Past Surgical History:  Procedure Laterality Date  . CESAREAN SECTION     x 2  . CHOLECYSTECTOMY N/A 03/29/2012   Procedure: LAPAROSCOPIC CHOLECYSTECTOMY WITH INTRAOPERATIVE CHOLANGIOGRAM;  Surgeon: Joyice Faster. Cornett, MD;  Location: [facility];  Service: General;  Laterality: N/A;  . TONSILLECTOMY    . TUBAL LIGATION      OB History    Gravida Para Term Preterm AB Living   2 2 2     2    SAB TAB Ectopic Multiple Live Births           2      Allergies  Allergen Reactions  . Bupropion Hcl Other (See Comments)    Unknown reaction: Pt states that she remembers welts post taking medication  . Squid Oil Swelling  . St Johns Wort Rash    Social History   Social History  . Marital status: Divorced    Spouse name: N/A  . Number of children: N/A  . Years of education: N/A   Social History Main Topics  . Smoking status: Never Smoker  . Smokeless tobacco: Never Used  . Alcohol use Yes     Comment: occasionally  . Drug use: No  . Sexual activity: Yes    Birth control/ protection: Surgical     Comment: tubal   Other Topics Concern  . None   Social History Narrative  . None    Family History  Problem Relation Age of Onset  . Asthma Daughter   . Diabetes Maternal Grandmother   . Hypertension Maternal Grandmother   . Congestive Heart Failure Maternal Grandmother   . Emphysema Maternal Grandmother   .  Cancer Maternal Grandmother        lung  . Other Father        heart problems  . Hypertension Mother   . Other Mother        hardening of heart  . Hypertension Brother   . Diabetes Brother   . Other Son        born with lypomengeocele

## 2016-11-30 ENCOUNTER — Ambulatory Visit (INDEPENDENT_AMBULATORY_CARE_PROVIDER_SITE_OTHER): Payer: Self-pay

## 2016-11-30 ENCOUNTER — Encounter: Payer: Self-pay | Admitting: Obstetrics & Gynecology

## 2016-11-30 ENCOUNTER — Ambulatory Visit (INDEPENDENT_AMBULATORY_CARE_PROVIDER_SITE_OTHER): Payer: Self-pay | Admitting: Obstetrics & Gynecology

## 2016-11-30 VITALS — BP 140/90 | HR 80 | Wt 194.0 lb

## 2016-11-30 DIAGNOSIS — N941 Unspecified dyspareunia: Secondary | ICD-10-CM

## 2016-11-30 DIAGNOSIS — D219 Benign neoplasm of connective and other soft tissue, unspecified: Secondary | ICD-10-CM

## 2016-11-30 DIAGNOSIS — N946 Dysmenorrhea, unspecified: Secondary | ICD-10-CM

## 2016-11-30 DIAGNOSIS — N921 Excessive and frequent menstruation with irregular cycle: Secondary | ICD-10-CM

## 2016-11-30 DIAGNOSIS — D250 Submucous leiomyoma of uterus: Secondary | ICD-10-CM

## 2016-11-30 NOTE — Progress Notes (Signed)
PELVIC US TA/TV: enlarged heterogeneous anteverted uterus w/ mult.fibroids,(#1) submucosal mid uterus 5.8 x 4.8 x 4.4 cm,(#2) fundal subserosal fibroid 6 x 5.5 x 6.2 cm,small amount of complex fluid w/in the endometrium,endometrium is distorted by the submucosal fibroid,EEC 7.8 mm,normal ovaries bilat,limited view of left ovary,no free fluid,no pain during ultrasound

## 2016-11-30 NOTE — Progress Notes (Signed)
Follow up appointment for results  Chief Complaint  Patient presents with  . Follow-up    ultrasound/ stop taking Megace due side effect/ spotting    Blood pressure 140/90, pulse 80, weight 194 lb (88 kg), last menstrual period 11/26/2016.    GYNECOLOGIC SONOGRAM   Yvonne Mckee is a 51 y.o. [id number] LMP 11/26/2016 for a pelvic sonogram for menometrorrhagia.  Uterus                      15 x 6.6 x 7.6 cm, vol 390 ml,enlarged heterogeneous anteverted uterus w/ mult.fibroids  Endometrium          7.8 mm, symmetrical, small amount of complex fluid w/in the endometrium,endometrium is distorted by the submucosal fibroid  Right ovary             2.3 x 2 x 2.7 cm, wnl  Left ovary                2.5 x 1.1 x 1.8 cm, wnl (limited view)  Fibroids                  (#1) submucosal mid uterus 5.8 x 4.8 x 4.4 cm,(#2) fundal subserosal fibroid 6 x 5.5 x 6.2 cm  Technician Comments:  PELVIC US TA/TV: enlarged heterogeneous anteverted uterus w/ mult.fibroids,(#1) submucosal mid uterus 5.8 x 4.8 x 4.4 cm,(#2) fundal subserosal fibroid 6 x 5.5 x 6.2 cm,small amount of complex fluid w/in the endometrium,endometrium is distorted by the submucosal fibroid,EEC 7.8 mm,normal ovaries bilat,limited view of left ovary,no free fluid,no pain during ultrasound   [employer] 11/30/2016 11:21 AM   MEDS ordered this encounter: Meds ordered this encounter  Medications  . DISCONTD: metFORMIN (GLUCOPHAGE) 500 MG tablet    Sig: Take by mouth 2 (two) times daily with a meal.    Orders for this encounter: No orders of the defined types were placed in this encounter.   Impression: Menometrorrhagia  Dysmenorrhea  Enlarged fibroid uterus  Previous cesarean section x2  Plan: Patient's enlarged uterus and her symptomatology, please refer to my note on 11/02/2016, will prefer to proceed with a total abdominal hysterectomy with removal of both tubes and ovaries given the patient's age  I  discussed the ultrasound findings with the patient in detail as well as the surgery  Pt understands the risks of surgery including but not limited t  excessive bleeding requiring transfusion or reoperation, post-operative infection requiring prolonged hospitalization or re-hospitalization and antibiotic therapy, and damage to other organs including bladder, bowel, ureters and major vessels.  The patient also understands the alternative treatment options which were discussed in full.  All questions were answered.  Arrangements will be made for her surgery in November per patient request  Follow Up: Return in about 4 weeks (around 12/30/2016) for [place], with Dr Elonda Husky.     Face to face time:  25 minutes  Greater than 50% of the visit time was spent in counseling and coordination of care with the patient.  The summary and outline of the counseling and care coordination is summarized in the note above.   All questions were answered.    Past Medical History:  Diagnosis Date  . Anemia   . Anxiety   . Calculus of gallbladder with acute cholecystitis, without mention of obstruction 03/30/2012  . Depression   . Depression 03/30/2012  . Diabetes mellitus without complication ([place])   . GERD (gastroesophageal reflux disease)   . Hypertension   .  Menometrorrhagia   . Sciatica   . Sciatica 03/30/2012  . Severe obesity (BMI >= 40) ([place]) 03/30/2012    Past Surgical History:  Procedure Laterality Date  . CESAREAN SECTION     x 2  . TONSILLECTOMY    . TUBAL LIGATION      OB History    Gravida Para Term Preterm AB Living   2 2 2     2    SAB TAB Ectopic Multiple Live Births           2      Allergies  Allergen Reactions  . Bupropion Hcl Other (See Comments)    Unknown reaction: Pt states that she remembers welts post taking medication  . Squid Oil Swelling  . St Johns Wort Rash    Social History   Socioeconomic History  . Marital status: Divorced    Spouse name: None  .  Number of children: None  . Years of education: None  . Highest education level: None  Social Needs  . Financial resource strain: None  . Food insecurity - worry: None  . Food insecurity - inability: None  . Transportation needs - medical: None  . Transportation needs - non-medical: None  Occupational History  . None  Tobacco Use  . Smoking status: Never Smoker  . Smokeless tobacco: Never Used  Substance and Sexual Activity  . Alcohol use: Yes    Comment: occasionally  . Drug use: No  . Sexual activity: Yes    Birth control/protection: Surgical    Comment: tubal  Other Topics Concern  . None  Social History Narrative  . None    Family History  Problem Relation Age of Onset  . Asthma Daughter   . Diabetes Maternal Grandmother   . Hypertension Maternal Grandmother   . Congestive Heart Failure Maternal Grandmother   . Emphysema Maternal Grandmother   . Cancer Maternal Grandmother        lung  . Other Father        heart problems  . Hypertension Mother   . Other Mother        hardening of heart  . Hypertension Brother   . Diabetes Brother   . Other Son        born with lypomengeocele

## 2016-12-01 ENCOUNTER — Other Ambulatory Visit: Payer: Self-pay

## 2016-12-08 ENCOUNTER — Ambulatory Visit (HOSPITAL_COMMUNITY): Payer: Self-pay

## 2016-12-13 ENCOUNTER — Telehealth: Payer: Self-pay | Admitting: Obstetrics & Gynecology

## 2016-12-13 NOTE — Telephone Encounter (Signed)
Sorry that's the best I got, I don't have any other medication that will work if this one doesn't

## 2016-12-13 NOTE — Telephone Encounter (Signed)
Patient called stating that she was given a medication by dr. Elonda Husky to stop her bleeding but this time it is not working. Pt was suppose to take the medication before her surgery and it has not worked. Please contact pt

## 2016-12-14 NOTE — Patient Instructions (Signed)
Yvonne Mckee  12/14/2016     @PREFPERIOPPHARMACY @   Your procedure is scheduled on 12/22/2016  Report to [facility] at 10:00 A.M.  Call this number if you have problems the morning of surgery:  [phone]   Remember:  Do not eat food or drink liquids after midnight.  Take these medicines the morning of surgery with A SIP OF WATER Flexeril if needed, Prozac, Prevacid, Megace   Do not wear jewelry, make-up or nail polish.  Do not wear lotions, powders, or perfumes, or deoderant.  Do not shave 48 hours prior to surgery.  Men may shave face and neck.  Do not bring valuables to the hospital.  [facility] is not responsible for any belongings or valuables.  Contacts, dentures or bridgework may not be worn into surgery.  Leave your suitcase in the car.  After surgery it may be brought to your room.  For patients admitted to the hospital, discharge time will be determined by your treatment team.  Patients discharged the day of surgery will not be allowed to drive home.    Please read over the following fact sheets that you were given. Surgical Site Infection Prevention and Anesthesia Post-op Instructions     PATIENT INSTRUCTIONS POST-ANESTHESIA  IMMEDIATELY FOLLOWING SURGERY:  Do not drive or operate machinery for the first twenty four hours after surgery.  Do not make any important decisions for twenty four hours after surgery or while taking narcotic pain medications or sedatives.  If you develop intractable nausea and vomiting or a severe headache please notify your doctor immediately.  FOLLOW-UP:  Please make an appointment with your surgeon as instructed. You do not need to follow up with anesthesia unless specifically instructed to do so.  WOUND CARE INSTRUCTIONS (if applicable):  Keep a dry clean dressing on the anesthesia/puncture wound site if there is drainage.  Once the wound has quit draining you may leave it open to air.  Generally you should leave the bandage  intact for twenty four hours unless there is drainage.  If the epidural site drains for more than 36-48 hours please call the anesthesia department.  QUESTIONS?:  Please feel free to call your physician or the hospital operator if you have any questions, and they will be happy to assist you.      Abdominal Hysterectomy Abdominal hysterectomy is a surgical procedure to remove the womb (uterus). The uterus is the muscular organ that houses a developing baby. This surgery may be done if:  You have cancer.  You have growths (tumors or fibroids) in the uterus.  You have long-term (chronic) pain.  You are bleeding.  Your uterus has slipped down into your vagina (uterine prolapse).  You have a condition in which the tissue that lines the uterus grows outside of its normal location (endometriosis).  You have an infection in your uterus.  You are having problems with your menstrual cycle.  Depending on why you are having this procedure, you may also have other reproductive organs removed. These could include:  The part of your vagina that connects with your uterus (cervix).  The organs that make eggs (ovaries).  The tubes that connect the ovaries to the uterus (fallopian tubes).  Tell a health care provider about:  Any allergies you have.  All medicines you are taking, including vitamins, herbs, eye drops, creams, and over-the-counter medicines.  Any problems you or family members have had with anesthetic medicines.  Any blood disorders you have.  Any  surgeries you have had.  Any medical conditions you have.  Whether you are pregnant or may be pregnant. What are the risks? Generally, this is a safe procedure. However, problems may occur, including:  Bleeding.  Infection.  Allergic reactions to medicines or dyes.  Damage to other structures or organs.  Nerve injury.  Decreased interest in sex or pain during sex.  Blood clots that can break free and travel to your  lungs.  What happens before the procedure? Staying hydrated Follow instructions from your health care provider about hydration, which may include:  Up to 2 hours before the procedure - you may continue to drink clear liquids, such as water, clear fruit juice, black coffee, and plain tea  Eating and drinking restrictions Follow instructions from your health care provider about eating and drinking, which may include:  8 hours before the procedure - stop eating heavy meals or foods such as meat, fried foods, or fatty foods.  6 hours before the procedure - stop eating light meals or foods, such as toast or cereal.  6 hours before the procedure - stop drinking milk or drinks that contain milk.  2 hours before the procedure - stop drinking clear liquids.  Medicines  Ask your health care provider about: ? Changing or stopping your regular medicines. This is especially important if you are taking diabetes medicines or blood thinners. ? Taking medicines such as aspirin and ibuprofen. These medicines can thin your blood. Do not take these medicines before your procedure if your health care provider instructs you not to.  You may be given antibiotic medicine to help prevent infection. Take it as told by your health care provider.  You may be asked to take laxatives to prevent constipation. General instructions  Ask your health care provider how your surgical site will be marked or identified.  You may be asked to shower with a germ-killing soap.  Plan to have someone take you home from the hospital.  Do not use any products that contain nicotine or tobacco, such as cigarettes and e-cigarettes. If you need help quitting, ask your health care provider.  You may have an exam or testing.  You may have a blood or urine sample taken.  You may need to have an enema to clean out your rectum and lower colon.  This procedure can affect the way you feel about yourself. Talk to your health care  provider about the physical and emotional changes this procedure may cause. What happens during the procedure?  To lower your risk of infection: ? Your health care team will wash or sanitize their hands. ? Your skin will be washed with soap. ? Hair may be removed from the surgical area.  An IV tube will be inserted into one of your veins.  You will be given one or more of the following: ? A medicine to help you relax (sedative). ? A medicine to make you fall asleep (general anesthetic).  Tight-fitting (compression) stockings will be placed on your legs to promote circulation.  A thin, flexible tube (catheter) will be inserted to help drain your urine.  The surgeon will make a cut (incision) through the skin in your lower belly. The incision may go side-to-side or up-and-down.  The surgeon will move aside the body tissue that covers your uterus. The surgeon will then carefully take out your uterus along with any of the other organs that need to be removed.  Bleeding will be controlled with clamps or sutures.  The surgeon will close your incision with stitches (sutures), skin glue, or adhesive strips.  A bandage (dressing) will be placed over the incision. The procedure may vary among health care providers and hospitals. What happens after the procedure?  You will be given pain medicine as needed.  Your blood pressure, heart rate, breathing rate, and blood oxygen level will be monitored until the medicines you were given have worn off.  You will need to stay in the hospital to recover for one to two days. Ask your health care provider how long you will need to stay in the hospital after your procedure.  You may have a liquid diet at first. You will most likely return to your usual diet the day after surgery.  You will still have the urinary catheter in place. It will likely be removed the day after surgery.  You may have to wear compression stockings. These stockings help to  prevent blood clots and reduce swelling in your legs.  You will be encouraged to walk as soon as possible. You will also use a device or do breathing exercises to keep your lungs clear.  You may need to use a sanitary napkin for vaginal discharge. Summary  Abdominal hysterectomy is a surgical procedure to remove the womb (uterus). The uterus is the muscular organ that houses a developing baby.  This procedure can affect the way you feel about yourself. Talk to your health care provider about the physical and emotional changes this procedure may cause.  You will be given medicines for pain after the procedure.  You will need to stay in the hospital to recover. Ask your health care provider how long you will need to stay in the hospital after your procedure. This information is not intended to replace advice given to you by your health care provider. Make sure you discuss any questions you have with your health care provider. Document Released: 02/06/2013 Document Revised: 01/21/2016 Document Reviewed: 01/21/2016 Elsevier Interactive Patient Education  2017 [employer] Bilateral Salpingo-Oophorectomy Bilateral salpingo-oophorectomy is the surgical removal of both fallopian tubes and both ovaries. The ovaries are reproductive organs that produce eggs in women. The fallopian tubes allow eggs to move from the ovaries to the uterus. You may need this procedure if you:  Have had your uterus removed. This procedure is usually done after the uterus is removed.  Have cancer of the fallopian tubes or ovaries.  Have a high risk of cancer of the fallopian tubes or ovaries.  There are three different techniques that can be used for this procedure:  Open. One large incision will be made in your abdomen.  Laparoscopic. A thin, lighted tube with a small camera on the end (laparoscope) will be used to help perform the procedure. The laparoscope will allow your surgeon to make several small incisions  in the abdomen instead of one large incision.  Robot-assisted. A computer will be used to control surgical instruments that are attached to robotic arms. A laparoscope may also be used with this technique.  As a result of this procedure, you will become sterile (unable to become pregnant), and you will go into menopause (no longer able to have menstrual periods). You may develop symptoms of menopause such as hot flashes, night sweats, and mood changes. Your sex drive may also be affected. Tell a health care provider about:  Any allergies you have.  All medicines you are taking, including vitamins, herbs, eye drops, creams, and over-the-counter medicines.  Any problems you or  family members have had with anesthetic medicines.  Any blood disorders you have.  Any surgeries you have had.  Any medical conditions you have.  Whether you are pregnant or may be pregnant. What are the risks? Generally, this is a safe procedure. However, problems may occur, including:  Infection.  Bleeding.  Allergic reactions to medicines.  Damage to other structures or organs.  Blood clots in the legs or lungs.  What happens before the procedure? Staying hydrated Follow instructions from your health care provider about hydration, which may include:  Up to 2 hours before the procedure - you may continue to drink clear liquids, such as water, clear fruit juice, black coffee, and plain tea.  Eating and drinking restrictions Follow instructions from your health care provider about eating and drinking, which may include:  8 hours before the procedure - stop eating heavy meals or foods such as meat, fried foods, or fatty foods.  6 hours before the procedure - stop eating light meals or foods, such as toast or cereal.  6 hours before the procedure - stop drinking milk or drinks that contain milk.  2 hours before the procedure - stop drinking clear liquids.  Medicines  Ask your health care provider  about: ? Changing or stopping your regular medicines. This is especially important if you are taking diabetes medicines or blood thinners. ? Taking medicines such as aspirin and ibuprofen. These medicines can thin your blood. Do not take these medicines before your procedure if your health care provider instructs you not to.  You may be given antibiotic medicine to help prevent infection. General instructions  Do not smoke for at least 2 weeks before your procedure or as told by your health care provider.  You may have an exam or testing.  You may have a blood or urine sample taken.  Ask your health care provider how your surgical site will be marked or identified.  Plan to have someone take you home from the hospital.  If you will be going home right after the procedure, plan to have someone with you for 24 hours. What happens during the procedure?  To reduce your risk of infection: ? Your health care team will wash or sanitize their hands. ? Your skin will be washed with soap. ? Hair may be removed from the surgical area.  An IV tube will be inserted into one of your veins.  You will be given one or more of the following: ? A medicine to help you relax (sedative). ? A medicine to make you fall asleep (general anesthetic).  A thin tube (catheter) will be inserted through your urethra and into your bladder. The catheter drains urine during your procedure.  Depending on the type of surgery you are having, your surgeon will do one of the following: ? Make one incision in your abdomen (open surgery). ? Make two small incisions in your abdomen (laparoscopic surgery). The laparoscope will be passed through one incision, and surgical instruments will be passed through the other. ? Make several small incisions in your abdomen (robot-assisted surgery). A laparoscope and other surgical instruments may be passed through the incisions.  Your fallopian tubes and ovaries will be cut away  from the uterus and removed.  Your blood vessels will be clamped and tied to prevent too much bleeding.  The incision(s) in your abdomen will be closed with stitches (sutures) or staples.  A bandage (dressing) may be placed over your incision(s). The procedure may vary among  health care providers and hospitals. What happens after the procedure?  Your blood pressure, heart rate, breathing rate, and blood oxygen level will be monitored until the medicines you were given have worn off.  You may continue to receive fluids and medicines through an IV tube.  You may continue to have a catheter draining your urine.  You may have to wear compression stockings. These stockings help to prevent blood clots and reduce swelling in your legs.  You will be given pain medicine as needed.  Do not drive for 24 hours if you received a sedative. Summary  Bilateral salpingo-oophorectomy is a procedure to remove both fallopian tubes and both ovaries.  There are three different techniques that can be used for this procedure, including open, laparoscopic, and robotic. Talk with your health care provider about how your procedure will be done.  As a result of this procedure, you will become sterile and you will go into menopause.  Plan to have someone take you home from the hospital. This information is not intended to replace advice given to you by your health care provider. Make sure you discuss any questions you have with your health care provider. Document Released: 02/01/2005 Document Revised: 03/08/2016 Document Reviewed: 03/08/2016 Elsevier Interactive Patient Education  [employer].

## 2016-12-14 NOTE — Telephone Encounter (Signed)
Informed patient that per Dr Elonda Husky there is no other medication that can be prescribed for the bleeding. Pt verbalized understanding.

## 2016-12-15 ENCOUNTER — Other Ambulatory Visit: Payer: Self-pay | Admitting: Obstetrics & Gynecology

## 2016-12-17 ENCOUNTER — Encounter (HOSPITAL_COMMUNITY): Payer: Self-pay

## 2016-12-17 ENCOUNTER — Other Ambulatory Visit: Payer: Self-pay

## 2016-12-17 ENCOUNTER — Encounter (HOSPITAL_COMMUNITY)
Admission: RE | Admit: 2016-12-17 | Discharge: 2016-12-17 | Disposition: A | Discharge status: Home / Self Care | Payer: Self-pay | Source: Ambulatory Visit | Attending: Obstetrics & Gynecology | Admitting: Obstetrics & Gynecology

## 2016-12-17 DIAGNOSIS — Z01818 Encounter for other preprocedural examination: Secondary | ICD-10-CM | POA: Insufficient documentation

## 2016-12-17 DIAGNOSIS — R9431 Abnormal electrocardiogram [ECG] [EKG]: Secondary | ICD-10-CM | POA: Insufficient documentation

## 2016-12-17 HISTORY — DX: Gastro-esophageal reflux disease without esophagitis: K21.9

## 2016-12-17 HISTORY — DX: Anemia, unspecified: D64.9

## 2016-12-17 LAB — HEMOGLOBIN A1C
Hgb A1c MFr Bld: 7.8 % — ABNORMAL HIGH (ref 4.8–5.6)
Mean Plasma Glucose: 177.16 mg/dL

## 2016-12-17 LAB — GLUCOSE, CAPILLARY: GLUCOSE-CAPILLARY: 152 mg/dL — AB (ref 65–99)

## 2016-12-17 LAB — URINALYSIS, MICROSCOPIC (REFLEX)
Bacteria, UA: NONE SEEN
SQUAMOUS EPITHELIAL / LPF: NONE SEEN

## 2016-12-17 LAB — COMPREHENSIVE METABOLIC PANEL
ALBUMIN: 4.2 g/dL (ref 3.5–5.0)
ALK PHOS: 67 U/L (ref 38–126)
ALT: 32 U/L (ref 14–54)
AST: 29 U/L (ref 15–41)
Anion gap: 12 (ref 5–15)
BILIRUBIN TOTAL: 0.2 mg/dL — AB (ref 0.3–1.2)
BUN: 10 mg/dL (ref 6–20)
CALCIUM: 9.1 mg/dL (ref 8.9–10.3)
CO2: 23 mmol/L (ref 22–32)
CREATININE: 0.72 mg/dL (ref 0.44–1.00)
Chloride: 102 mmol/L (ref 101–111)
GFR calc Af Amer: >60 mL/min (ref >60)
GLUCOSE: 141 mg/dL — AB (ref 65–99)
POTASSIUM: 3.3 mmol/L — AB (ref 3.5–5.1)
Sodium: 137 mmol/L (ref 135–145)
TOTAL PROTEIN: 7.7 g/dL (ref 6.5–8.1)

## 2016-12-17 LAB — URINALYSIS, ROUTINE W REFLEX MICROSCOPIC
BILIRUBIN URINE: NEGATIVE
Glucose, UA: NEGATIVE mg/dL
KETONES UR: NEGATIVE mg/dL
NITRITE: NEGATIVE
SPECIFIC GRAVITY, URINE: 1.01 (ref 1.005–1.030)
pH: 5.5 (ref 5.0–8.0)

## 2016-12-17 LAB — RAPID HIV SCREEN (HIV 1/2 AB+AG)
HIV 1/2 Antibodies: NONREACTIVE
HIV-1 P24 Antigen - HIV24: NONREACTIVE

## 2016-12-17 LAB — CBC
HEMATOCRIT: 40 % (ref 36.0–46.0)
HEMOGLOBIN: 13.1 g/dL (ref 12.0–15.0)
MCH: 27 pg (ref 26.0–34.0)
MCHC: 32.8 g/dL (ref 30.0–36.0)
MCV: 82.5 fL (ref 78.0–100.0)
Platelets: 474 10*3/uL — ABNORMAL HIGH (ref 150–400)
RBC: 4.85 MIL/uL (ref 3.87–5.11)
RDW: 13.6 % (ref 11.5–15.5)
WBC: 9.6 10*3/uL (ref 4.0–10.5)

## 2016-12-17 LAB — HCG, QUANTITATIVE, PREGNANCY: HCG, BETA CHAIN, QUANT, S: 1 m[IU]/mL (ref <5)

## 2016-12-22 ENCOUNTER — Encounter (HOSPITAL_COMMUNITY): Payer: Self-pay

## 2016-12-22 ENCOUNTER — Inpatient Hospital Stay (HOSPITAL_COMMUNITY): Payer: Self-pay | Admitting: Anesthesiology

## 2016-12-22 ENCOUNTER — Observation Stay (HOSPITAL_COMMUNITY)
Admission: RE | Admit: 2016-12-22 | Discharge: 2016-12-23 | Disposition: A | Discharge status: Home / Self Care | Payer: Self-pay | Source: Ambulatory Visit | Attending: Obstetrics & Gynecology | Admitting: Obstetrics & Gynecology

## 2016-12-22 ENCOUNTER — Encounter (HOSPITAL_COMMUNITY)
Admission: RE | Disposition: A | Discharge status: Home / Self Care | Payer: Self-pay | Source: Ambulatory Visit | Attending: Obstetrics & Gynecology

## 2016-12-22 DIAGNOSIS — I10 Essential (primary) hypertension: Secondary | ICD-10-CM | POA: Insufficient documentation

## 2016-12-22 DIAGNOSIS — Z90710 Acquired absence of both cervix and uterus: Secondary | ICD-10-CM

## 2016-12-22 DIAGNOSIS — Z9079 Acquired absence of other genital organ(s): Secondary | ICD-10-CM

## 2016-12-22 DIAGNOSIS — Z98891 History of uterine scar from previous surgery: Secondary | ICD-10-CM

## 2016-12-22 DIAGNOSIS — Z7984 Long term (current) use of oral hypoglycemic drugs: Secondary | ICD-10-CM | POA: Insufficient documentation

## 2016-12-22 DIAGNOSIS — D259 Leiomyoma of uterus, unspecified: Secondary | ICD-10-CM | POA: Insufficient documentation

## 2016-12-22 DIAGNOSIS — K219 Gastro-esophageal reflux disease without esophagitis: Secondary | ICD-10-CM | POA: Insufficient documentation

## 2016-12-22 DIAGNOSIS — N3289 Other specified disorders of bladder: Secondary | ICD-10-CM | POA: Insufficient documentation

## 2016-12-22 DIAGNOSIS — N946 Dysmenorrhea, unspecified: Secondary | ICD-10-CM | POA: Insufficient documentation

## 2016-12-22 DIAGNOSIS — N920 Excessive and frequent menstruation with regular cycle: Secondary | ICD-10-CM

## 2016-12-22 DIAGNOSIS — D250 Submucous leiomyoma of uterus: Secondary | ICD-10-CM

## 2016-12-22 DIAGNOSIS — N921 Excessive and frequent menstruation with irregular cycle: Principal | ICD-10-CM | POA: Insufficient documentation

## 2016-12-22 DIAGNOSIS — N72 Inflammatory disease of cervix uteri: Secondary | ICD-10-CM | POA: Insufficient documentation

## 2016-12-22 DIAGNOSIS — Z6836 Body mass index (BMI) 36.0-36.9, adult: Secondary | ICD-10-CM | POA: Insufficient documentation

## 2016-12-22 DIAGNOSIS — Z90722 Acquired absence of ovaries, bilateral: Secondary | ICD-10-CM

## 2016-12-22 DIAGNOSIS — E119 Type 2 diabetes mellitus without complications: Secondary | ICD-10-CM | POA: Insufficient documentation

## 2016-12-22 DIAGNOSIS — D251 Intramural leiomyoma of uterus: Secondary | ICD-10-CM

## 2016-12-22 HISTORY — PX: ABDOMINAL HYSTERECTOMY: SHX81

## 2016-12-22 HISTORY — PX: SALPINGOOPHORECTOMY: SHX82

## 2016-12-22 LAB — TYPE AND SCREEN
ABO/RH(D): O POS
Antibody Screen: POSITIVE
DAT, IgG: NEGATIVE
UNIT DIVISION: 0
UNIT DIVISION: 0

## 2016-12-22 LAB — BPAM RBC
BLOOD PRODUCT EXPIRATION DATE: 201811292359
Blood Product Expiration Date: 201811292359
UNIT TYPE AND RH: 5100
Unit Type and Rh: 5100

## 2016-12-22 LAB — GLUCOSE, CAPILLARY
Glucose-Capillary: 142 mg/dL — ABNORMAL HIGH (ref 65–99)
Glucose-Capillary: 187 mg/dL — ABNORMAL HIGH (ref 65–99)

## 2016-12-22 SURGERY — HYSTERECTOMY, ABDOMINAL
Anesthesia: General | Site: Abdomen

## 2016-12-22 MED ORDER — PROPOFOL 10 MG/ML IV BOLUS
INTRAVENOUS | Status: DC | PRN
  Administered 2016-12-22: 150 mg via INTRAVENOUS

## 2016-12-22 MED ORDER — BUPIVACAINE LIPOSOME 1.3 % IJ SUSP
INTRAMUSCULAR | Status: AC
  Filled 2016-12-22: qty 20

## 2016-12-22 MED ORDER — ROCURONIUM BROMIDE 50 MG/5ML IV SOLN
INTRAVENOUS | Status: AC
  Filled 2016-12-22: qty 1

## 2016-12-22 MED ORDER — ROCURONIUM BROMIDE 100 MG/10ML IV SOLN
INTRAVENOUS | Status: DC | PRN
  Administered 2016-12-22: 30 mg via INTRAVENOUS
  Administered 2016-12-22: 10 mg via INTRAVENOUS

## 2016-12-22 MED ORDER — KETOROLAC TROMETHAMINE 30 MG/ML IJ SOLN
30.0000 mg | Freq: Three times a day (TID) | INTRAMUSCULAR | Status: AC
  Administered 2016-12-22 – 2016-12-23 (×2): 30 mg via INTRAVENOUS
  Filled 2016-12-22 (×2): qty 1

## 2016-12-22 MED ORDER — BUPIVACAINE LIPOSOME 1.3 % IJ SUSP
20.0000 mL | Freq: Once | INTRAMUSCULAR | Status: DC
  Filled 2016-12-22: qty 20

## 2016-12-22 MED ORDER — HYDROMORPHONE HCL 1 MG/ML IJ SOLN
0.2500 mg | INTRAMUSCULAR | Status: DC | PRN
  Administered 2016-12-22 (×4): 0.5 mg via INTRAVENOUS
  Filled 2016-12-22 (×2): qty 1

## 2016-12-22 MED ORDER — OXYCODONE-ACETAMINOPHEN 7.5-325 MG PO TABS
1.0000 | ORAL_TABLET | ORAL | Status: DC | PRN
  Administered 2016-12-23 (×3): 2 via ORAL
  Filled 2016-12-22 (×3): qty 2

## 2016-12-22 MED ORDER — DOCUSATE SODIUM 100 MG PO CAPS
100.0000 mg | ORAL_CAPSULE | Freq: Two times a day (BID) | ORAL | Status: DC
  Administered 2016-12-22 – 2016-12-23 (×2): 100 mg via ORAL
  Filled 2016-12-22 (×2): qty 1

## 2016-12-22 MED ORDER — KCL IN DEXTROSE-NACL 20-5-0.45 MEQ/L-%-% IV SOLN
INTRAVENOUS | Status: DC
  Administered 2016-12-22 (×2): via INTRAVENOUS

## 2016-12-22 MED ORDER — DIPHENHYDRAMINE HCL 25 MG PO CAPS
50.0000 mg | ORAL_CAPSULE | Freq: Every day | ORAL | Status: DC
  Administered 2016-12-22: 50 mg via ORAL
  Filled 2016-12-22: qty 2

## 2016-12-22 MED ORDER — KETOROLAC TROMETHAMINE 30 MG/ML IJ SOLN
30.0000 mg | Freq: Once | INTRAMUSCULAR | Status: AC
  Administered 2016-12-22: 30 mg via INTRAVENOUS
  Filled 2016-12-22: qty 1

## 2016-12-22 MED ORDER — SODIUM CHLORIDE 0.9 % IV SOLN
8.0000 mg | Freq: Four times a day (QID) | INTRAVENOUS | Status: DC | PRN
  Filled 2016-12-22: qty 4

## 2016-12-22 MED ORDER — FENTANYL CITRATE (PF) 250 MCG/5ML IJ SOLN
INTRAMUSCULAR | Status: AC
  Filled 2016-12-22: qty 5

## 2016-12-22 MED ORDER — MIDAZOLAM HCL 2 MG/2ML IJ SOLN
2.0000 mg | Freq: Once | INTRAMUSCULAR | Status: AC
  Administered 2016-12-22: 2 mg via INTRAVENOUS

## 2016-12-22 MED ORDER — MIDAZOLAM HCL 2 MG/2ML IJ SOLN
INTRAMUSCULAR | Status: AC
  Filled 2016-12-22: qty 2

## 2016-12-22 MED ORDER — ONDANSETRON HCL 4 MG PO TABS
8.0000 mg | ORAL_TABLET | Freq: Four times a day (QID) | ORAL | Status: DC | PRN
  Administered 2016-12-22 – 2016-12-23 (×2): 8 mg via ORAL
  Filled 2016-12-22: qty 2
  Filled 2016-12-22: qty 1
  Filled 2016-12-22: qty 2

## 2016-12-22 MED ORDER — PANTOPRAZOLE SODIUM 40 MG PO TBEC
40.0000 mg | DELAYED_RELEASE_TABLET | Freq: Every day | ORAL | Status: DC
  Administered 2016-12-22 – 2016-12-23 (×2): 40 mg via ORAL
  Filled 2016-12-22 (×2): qty 1

## 2016-12-22 MED ORDER — CEFAZOLIN SODIUM-DEXTROSE 2-4 GM/100ML-% IV SOLN
2.0000 g | INTRAVENOUS | Status: AC
  Administered 2016-12-22: 2 g via INTRAVENOUS
  Filled 2016-12-22: qty 100

## 2016-12-22 MED ORDER — HYDROMORPHONE HCL 1 MG/ML IJ SOLN
1.0000 mg | INTRAMUSCULAR | Status: DC | PRN
  Administered 2016-12-22 – 2016-12-23 (×5): 1 mg via INTRAVENOUS
  Filled 2016-12-22 (×5): qty 1

## 2016-12-22 MED ORDER — HYDROCHLOROTHIAZIDE 25 MG PO TABS
25.0000 mg | ORAL_TABLET | Freq: Every day | ORAL | Status: DC
  Administered 2016-12-22 – 2016-12-23 (×2): 25 mg via ORAL
  Filled 2016-12-22 (×2): qty 1

## 2016-12-22 MED ORDER — PROMETHAZINE HCL 25 MG/ML IJ SOLN
25.0000 mg | Freq: Four times a day (QID) | INTRAMUSCULAR | Status: DC | PRN
  Administered 2016-12-23: 25 mg via INTRAVENOUS
  Filled 2016-12-22: qty 1

## 2016-12-22 MED ORDER — FENTANYL CITRATE (PF) 100 MCG/2ML IJ SOLN
INTRAMUSCULAR | Status: DC | PRN
  Administered 2016-12-22 (×5): 50 ug via INTRAVENOUS

## 2016-12-22 MED ORDER — ASPIRIN 81 MG PO CHEW
81.0000 mg | CHEWABLE_TABLET | Freq: Every day | ORAL | Status: DC
  Administered 2016-12-22: 81 mg via ORAL
  Filled 2016-12-22: qty 1

## 2016-12-22 MED ORDER — EPHEDRINE SULFATE 50 MG/ML IJ SOLN
INTRAMUSCULAR | Status: AC
  Filled 2016-12-22: qty 1

## 2016-12-22 MED ORDER — BISACODYL 10 MG RE SUPP: 10.0000 mg | Freq: Every day | RECTAL | Status: DC | PRN

## 2016-12-22 MED ORDER — SODIUM CHLORIDE 0.9 % IJ SOLN
INTRAMUSCULAR | Status: AC
  Filled 2016-12-22: qty 10

## 2016-12-22 MED ORDER — LACTATED RINGERS IV SOLN: INTRAVENOUS | Status: DC

## 2016-12-22 MED ORDER — DEXAMETHASONE SODIUM PHOSPHATE 4 MG/ML IJ SOLN
INTRAMUSCULAR | Status: DC | PRN
  Administered 2016-12-22: 8 mg via INTRAVENOUS

## 2016-12-22 MED ORDER — FLUOXETINE HCL 20 MG PO CAPS
40.0000 mg | ORAL_CAPSULE | Freq: Every day | ORAL | Status: DC
  Administered 2016-12-22: 40 mg via ORAL
  Filled 2016-12-22: qty 2

## 2016-12-22 MED ORDER — METFORMIN HCL ER 500 MG PO TB24
500.0000 mg | ORAL_TABLET | Freq: Two times a day (BID) | ORAL | Status: DC
  Administered 2016-12-22 – 2016-12-23 (×2): 500 mg via ORAL
  Filled 2016-12-22 (×2): qty 1

## 2016-12-22 MED ORDER — LIDOCAINE HCL (PF) 1 % IJ SOLN
INTRAMUSCULAR | Status: AC
  Filled 2016-12-22: qty 5

## 2016-12-22 MED ORDER — SENNOSIDES-DOCUSATE SODIUM 8.6-50 MG PO TABS: 1.0000 | ORAL_TABLET | Freq: Every evening | ORAL | Status: DC | PRN

## 2016-12-22 MED ORDER — ONDANSETRON 4 MG PO TBDP: 4.0000 mg | ORAL_TABLET | Freq: Once | ORAL | Status: DC

## 2016-12-22 MED ORDER — LIDOCAINE HCL (CARDIAC) 20 MG/ML IV SOLN
INTRAVENOUS | Status: DC | PRN
  Administered 2016-12-22: 40 mg via INTRAVENOUS

## 2016-12-22 MED ORDER — ACETAMINOPHEN 325 MG PO TABS: 650.0000 mg | ORAL_TABLET | Freq: Once | ORAL | Status: DC

## 2016-12-22 MED ORDER — PROPOFOL 10 MG/ML IV BOLUS
INTRAVENOUS | Status: AC
  Filled 2016-12-22: qty 20

## 2016-12-22 MED ORDER — SUGAMMADEX SODIUM 500 MG/5ML IV SOLN
INTRAVENOUS | Status: DC | PRN
  Administered 2016-12-22: 180.6 mg via INTRAVENOUS

## 2016-12-22 MED ORDER — BUPIVACAINE LIPOSOME 1.3 % IJ SUSP
INTRAMUSCULAR | Status: DC | PRN
  Administered 2016-12-22: 20 mL

## 2016-12-22 MED ORDER — OXYCODONE HCL 5 MG PO TABS
5.0000 mg | ORAL_TABLET | Freq: Once | ORAL | Status: AC | PRN
  Administered 2016-12-22: 5 mg via ORAL
  Filled 2016-12-22: qty 1

## 2016-12-22 MED ORDER — SIMETHICONE 80 MG PO CHEW: 80.0000 mg | CHEWABLE_TABLET | Freq: Four times a day (QID) | ORAL | Status: DC | PRN

## 2016-12-22 MED ORDER — ZOLPIDEM TARTRATE 5 MG PO TABS
5.0000 mg | ORAL_TABLET | Freq: Every evening | ORAL | Status: DC | PRN
  Filled 2016-12-22: qty 1

## 2016-12-22 MED ORDER — HEMOSTATIC AGENTS (NO CHARGE) OPTIME
TOPICAL | Status: DC | PRN
  Administered 2016-12-22: 1 via TOPICAL

## 2016-12-22 MED ORDER — OXYCODONE HCL 5 MG/5ML PO SOLN: 5.0000 mg | Freq: Once | ORAL | Status: DC | PRN

## 2016-12-22 MED ORDER — LACTATED RINGERS IV SOLN
INTRAVENOUS | Status: DC | PRN
  Administered 2016-12-22 (×3): via INTRAVENOUS

## 2016-12-22 MED ORDER — SUCCINYLCHOLINE CHLORIDE 20 MG/ML IJ SOLN
INTRAMUSCULAR | Status: AC
  Filled 2016-12-22: qty 1

## 2016-12-22 MED ORDER — SODIUM CHLORIDE 0.9 % IR SOLN
Status: DC | PRN
  Administered 2016-12-22: 2000 mL

## 2016-12-22 SURGICAL SUPPLY — 45 items
ADH SKN CLS APL DERMABOND .7 (GAUZE/BANDAGES/DRESSINGS) ×2
APPLIER CLIP 13 LRG OPEN (CLIP)
APR CLP LRG 13 20 CLIP (CLIP)
BAG HAMPER (MISCELLANEOUS) ×3 IMPLANT
CLIP APPLIE 13 LRG OPEN (CLIP) IMPLANT
CLOTH BEACON ORANGE TIMEOUT ST (SAFETY) ×3 IMPLANT
COVER LIGHT HANDLE STERIS (MISCELLANEOUS) ×6 IMPLANT
DERMABOND ADVANCED (GAUZE/BANDAGES/DRESSINGS) ×1
DERMABOND ADVANCED .7 DNX12 (GAUZE/BANDAGES/DRESSINGS) ×2 IMPLANT
DRAPE WARM FLUID 44X44 (DRAPE) ×3 IMPLANT
DRSG OPSITE POSTOP 4X8 (GAUZE/BANDAGES/DRESSINGS) ×3 IMPLANT
DRSG TELFA 3X8 NADH (GAUZE/BANDAGES/DRESSINGS) ×3 IMPLANT
ELECT REM PT RETURN 9FT ADLT (ELECTROSURGICAL) ×3
ELECTRODE REM PT RTRN 9FT ADLT (ELECTROSURGICAL) ×2 IMPLANT
FORMALIN 10 PREFIL 120ML (MISCELLANEOUS) ×6 IMPLANT
FORMALIN 10 PREFIL 480ML (MISCELLANEOUS) ×3 IMPLANT
GLOVE BIOGEL PI IND STRL 7.0 (GLOVE) ×4 IMPLANT
GLOVE BIOGEL PI IND STRL 8 (GLOVE) ×2 IMPLANT
GLOVE BIOGEL PI INDICATOR 7.0 (GLOVE) ×2
GLOVE BIOGEL PI INDICATOR 8 (GLOVE) ×1
GLOVE ECLIPSE 8.0 STRL XLNG CF (GLOVE) ×3 IMPLANT
GOWN STRL REUS W/TWL LRG LVL3 (GOWN DISPOSABLE) ×6 IMPLANT
GOWN STRL REUS W/TWL XL LVL3 (GOWN DISPOSABLE) ×3 IMPLANT
INST SET MAJOR GENERAL (KITS) ×3 IMPLANT
KIT ROOM TURNOVER APOR (KITS) ×3 IMPLANT
MANIFOLD NEPTUNE II (INSTRUMENTS) ×3 IMPLANT
NDL HYPO 21X1.5 SAFETY (NEEDLE) ×2 IMPLANT
NEEDLE HYPO 21X1.5 SAFETY (NEEDLE) ×3 IMPLANT
NS IRRIG 1000ML POUR BTL (IV SOLUTION) ×6 IMPLANT
PACK ABDOMINAL MAJOR (CUSTOM PROCEDURE TRAY) ×3 IMPLANT
PAD ARMBOARD 7.5X6 YLW CONV (MISCELLANEOUS) ×3 IMPLANT
PAD DRESSING TELFA 3X8 NADH (GAUZE/BANDAGES/DRESSINGS) ×2 IMPLANT
SET BASIN LINEN APH (SET/KITS/TRAYS/PACK) ×3 IMPLANT
SUT CHROMIC 0 CT 1 (SUTURE) ×3 IMPLANT
SUT MON AB 3-0 SH 27 (SUTURE) IMPLANT
SUT PLAIN 2 0 XLH (SUTURE) IMPLANT
SUT VIC AB 0 CT1 27 (SUTURE) ×9
SUT VIC AB 0 CT1 27XBRD ANTBC (SUTURE) ×2 IMPLANT
SUT VIC AB 0 CT1 27XCR 8 STRN (SUTURE) ×4 IMPLANT
SUT VIC AB 0 CTX 36 (SUTURE) ×3
SUT VIC AB 0 CTX36XBRD ANTBCTR (SUTURE) ×2 IMPLANT
SUT VICRYL 3 0 (SUTURE) ×3 IMPLANT
SYR 20CC LL (SYRINGE) ×3 IMPLANT
TOWEL BLUE STERILE X RAY DET (MISCELLANEOUS) ×3 IMPLANT
TRAY FOLEY W/METER SILVER 16FR (SET/KITS/TRAYS/PACK) ×3 IMPLANT

## 2016-12-22 NOTE — Transfer of Care (Signed)
Immediate Anesthesia Transfer of Care Note  Patient: Yvonne Mckee  Procedure(s) Performed: HYSTERECTOMY ABDOMINAL (N/A Abdomen) BILATERAL SALPINGO OOPHORECTOMY (Bilateral Abdomen)  Patient Location: PACU  Anesthesia Type:General  Level of Consciousness: awake, oriented and patient cooperative  Airway & Oxygen Therapy: Patient Spontanous Breathing and Patient connected to face mask oxygen  Post-op Assessment: Report given to RN and Post -op Vital signs reviewed and stable  Post vital signs: Reviewed and stable  Last Vitals:  Vitals:   12/22/16 1018  BP: 135/81  Pulse: 81  Resp: 14  Temp: 36.9 C  SpO2: 96%    Last Pain:  Vitals:   12/22/16 1018  TempSrc: Oral         Complications: No apparent anesthesia complications

## 2016-12-22 NOTE — Anesthesia Postprocedure Evaluation (Signed)
Anesthesia Post Note  Patient: Yvonne Mckee  Procedure(s) Performed: HYSTERECTOMY ABDOMINAL (N/A Abdomen) BILATERAL SALPINGO OOPHORECTOMY (Bilateral Abdomen)  Patient location during evaluation: PACU Anesthesia Type: General Level of consciousness: awake and alert, oriented and patient cooperative Pain management: pain level controlled Vital Signs Assessment: post-procedure vital signs reviewed and stable Respiratory status: spontaneous breathing Cardiovascular status: stable Postop Assessment: no apparent nausea or vomiting Anesthetic complications: no     Last Vitals:  Vitals:   12/22/16 1400 12/22/16 1415  BP: (!) 150/76 (!) 155/76  Pulse: 82 82  Resp: (!) 25 18  Temp:    SpO2: 100% 92%    Last Pain:  Vitals:   12/22/16 1400  TempSrc:   PainSc: 6                  ADAMS, AMY A

## 2016-12-22 NOTE — Addendum Note (Signed)
Addendum  created 12/22/16 1421 by Mickel Baas, CRNA   Charge Capture section accepted

## 2016-12-22 NOTE — Op Note (Signed)
Preoperative diagnosis:  1.  Menometrorrhagia                                          2.  Dysmenorrhea                                         3.  14 week size fibroid uterus                                         4.  Previous Caesarean section x 2  Postoperative diagnosis:  Same as above  Procedure:  Abdominal hysterectomy, total, with removal of both tubes and ovaries  Surgeon:  Florian Buff  Assistant:    Anesthesia:  General endotracheal  Preoperative clinical summary:   [place] referral from [place] because of increasingly prolonged periods Patient states that about 5 or 6 years ago she was on birth control pills and her periods were good but then when she came off her pills a became about a week with the first 2 or 3 days being heavy and then trailing off overall for 7 days of bleeding Whoever for the past 3 months or so she is been having again the really heavy bleeding the first 2 or 3 days but then essentially 2 weeks of total bleeding variable volumes variable color sometimes red sometimes brown and dark Cramping is real bad the first to 3 days with the patient not taking ibuprofen which helps she does not use a heating pad She does not have any pain with intercourse Sonogram reveals multiple fibroids with uterine volume 390 cc and with 2 previous C sections and no uterine descent, most reasonable operative solution is an abdominal approach, pt wants her cervix and tubes and ovaries removed as well    Intraoperative findings: 14 week size fibroid uterus, normal ovaries and tubes Bladder adhesions  Description of operation:  Patient was taken to the operating room and placed in the supine position where she underwent general endotracheal anesthesia.  She was then prepped and draped in the usual sterile fashion and a Foley catheter was placed for continuous bladder drainage.  A Pfannenstiel skin incision was made and carried down sharply  to the rectus fascia which was scored in the midline and extended laterally.  The fascia was taken off the muscles superiorly and inferiorly without difficulty.  The muscles were divided.  The peritoneal cavity was entered.    The upper abdomen was packed away. Both uterine cornu were grasped with Coker clamps.  The left round ligament was suture ligated and coagulated with the electrocautery unit.  The left vesicouterine serosal flap was created.  An avascular window in in the peritoneum was created and the utero-ovarian ligament was cross clamped, cut and suture ligated.  The right round ligament was suture ligated and cut with the electrocautery unit.  The vesicouterine serosal flap on the right was created.  An avascular window in the peritoneum was created and the right utero-ovarian ligament was cross clamped, cut and double suture ligated. The infundibulo pelvic ligaments were then cross clamped and the pedicle was double suture ligated in a fore and aft manner.  The uterine vessels were skeletonized bilaterally.  The uterine vessels were clamped bilaterally,  then cut and suture ligated.  Two more pedicles were taken down the cervix medial to the uterine vessels.  Each pedicle was clamped cut and suture ligated with good resulting hemostasis. The fundus was then amputated leaving the cervix and a bit of the lower uterine segment.   Serial pedicles were then taken down the cervix through the cardinal ligament each pedicle being clamped cut and tranfixion suture ligated with good hemostasis.  The vagina was cross clamped and the cervix was removed.  The vagina was closed with vaginal angle sutures and then with interrupted figure of eight sutures.  The pelvis was irrigated vigorously and all pedicles were examined and found to be hemostatic.   All specimens were sent to pathology for routine evaluation. The pelvis was irrigated.  All packs were removed and all counts were correct at this point x 3.  The  muscles and peritoneum were reapproximated loosely.  The fascia was closed with 0 Vicryl running.  The subcutaneous tissue was reapproximated using 2-0 plain gut.  20 cc of 1.3% exparel was injected into the subcutaneous tissue.  The skin was closed using 3-0 Vicryl on a Keith needle in a subcuticular fashion.  Dermabond was then applied for additional wound integrity and to serve as a postoperative bacterial barrier.  The patient was awakened from anesthesia taken to the recovery room in good stable condition. All sponge instrument and needle counts were correct x 3.  The patient received Ancef and Toradol prophylactically preoperatively.    Estimated blood loss for the procedure was 120  cc.  Florian Buff, MD  12/22/2016 1:27 PM

## 2016-12-22 NOTE — Anesthesia Preprocedure Evaluation (Addendum)
Anesthesia Evaluation  Patient identified by MRN, date of birth, ID band Patient awake    Airway Mallampati: I       Dental no notable dental hx. (+) Teeth Intact   Pulmonary    Pulmonary exam normal        Cardiovascular Exercise Tolerance: Poor hypertension, Pt. on medications Normal cardiovascular exam Rhythm:Regular Rate:Normal  Normal sinus rhythm Low voltage QRS Possible Lateral infarct , age undetermined  (2018)   Neuro/Psych    GI/Hepatic GERD  Medicated and Controlled,  Endo/Other  diabetes, Well Controlled, Type 2, Oral Hypoglycemic Agents  Renal/GU      Musculoskeletal   Abdominal Normal abdominal exam  (+)   Peds  Hematology  (+) anemia , Results for Yvonne Mckee, Yvonne Mckee (MRN [id number]) as of 12/22/2016 11:13  12/17/2016 08:17 Sodium: 137 Potassium: 3.3 (L) Chloride: 102 CO2: 23 Glucose: 141 (H) Mean Plasma Glucose: 177.16 BUN: 10 Creatinine: 0.72Results for Yvonne Mckee, Yvonne Mckee (MRN [id number]) as of 12/22/2016 11:13  12/17/2016 08:17 WBC: 9.6 RBC: 4.85 Hemoglobin: 13.1 HCT: 40.0 MCV: 82.5 MCH: 27.0 MCHC: 32.8 RDW: 13.6 Platelets: 474 (H)     Anesthesia Other Findings   Reproductive/Obstetrics                            Anesthesia Physical Anesthesia Plan  ASA: III  Anesthesia Plan: General   Post-op Pain Management:    Induction:   PONV Risk Score and Plan: 3  Airway Management Planned: Oral ETT  Additional Equipment:   Intra-op Plan:   Post-operative Plan:   Informed Consent: I have reviewed the patients History and Physical, chart, labs and discussed the procedure including the risks, benefits and alternatives for the proposed anesthesia with the patient or authorized representative who has indicated his/her understanding and acceptance.   Dental advisory given  Plan Discussed with: CRNA and Surgeon  Anesthesia Plan Comments:          Anesthesia Quick Evaluation

## 2016-12-22 NOTE — H&P (Signed)
Preoperative History and Physical  Yvonne Mckee is a 51 y.o. [id number] with Patient's last menstrual period was 11/26/2016. admitted for a TAH BSO.  Yvonne Mckee 's referral from [place] because of increasingly prolonged periods Patient states that about 5 or 6 years ago she was on birth control pills and her periods were good but then when she came off her pills a became about a week with the first 2 or 3 days being heavy and then trailing off overall for 7 days of bleeding Whoever for the past 3 months or so she is been having again the really heavy bleeding the first 2 or 3 days but then essentially 2 weeks of total bleeding variable volumes variable color sometimes red sometimes brown and dark Cramping is real bad the first to 3 days with the patient not taking ibuprofen which helps she does not use a heating pad She does not have any pain with intercourse Sonogram reveals multiple fibroids with uterine volume 390 cc and with 2 previous C sections and no uterine descent, most reasonable operative solution is an abdominal approach, pt wants her cervix and tubes and ovaries removed as well    PMH:    Past Medical History:  Diagnosis Date  . Anemia   . Anxiety   . Calculus of gallbladder with acute cholecystitis, without mention of obstruction 03/30/2012  . Depression   . Depression 03/30/2012  . Diabetes mellitus without complication ([place])   . GERD (gastroesophageal reflux disease)   . Hypertension   . Menometrorrhagia   . Sciatica   . Sciatica 03/30/2012  . Severe obesity (BMI >= 40) ([facility]) 03/30/2012    PSH:     Past Surgical History:  Procedure Laterality Date  . CESAREAN SECTION     x 2  . TONSILLECTOMY    . TUBAL LIGATION      POb/GynH:      OB History    Gravida Para Term Preterm AB Living   2 2 2     2    SAB TAB Ectopic Multiple Live Births           2      SH:   Social History   Tobacco Use  . Smoking status: Never Smoker   . Smokeless tobacco: Never Used  Substance Use Topics  . Alcohol use: Yes    Comment: occasionally  . Drug use: No    FH:    Family History  Problem Relation Age of Onset  . Asthma Daughter   . Diabetes Maternal Grandmother   . Hypertension Maternal Grandmother   . Congestive Heart Failure Maternal Grandmother   . Emphysema Maternal Grandmother   . Cancer Maternal Grandmother        lung  . Other Father        heart problems  . Hypertension Mother   . Other Mother        hardening of heart  . Hypertension Brother   . Diabetes Brother   . Other Son        born with lypomengeocele     Allergies:  Allergies  Allergen Reactions  . Bupropion Hcl Other (See Comments)    Unknown reaction: Pt states that she remembers welts post taking medication  . Squid Oil Swelling  . [place] Wort Rash    Medications:       Current Facility-Administered Medications:  .  bupivacaine liposome (EXPAREL) 1.3 % injection 266 mg, 20 mL,  Infiltration, Once, Florian Buff, MD .  ceFAZolin (ANCEF) IVPB 2g/100 mL premix, 2 g, Intravenous, On Call to OR, Eure, Mertie Clause, MD  Facility-Administered Medications Ordered in Other Encounters:  .  lactated ringers infusion, , , Continuous PRN, Adams, Amy A, CRNA  Review of Systems:   Review of Systems  Constitutional: Negative for fever, chills, weight loss, malaise/fatigue and diaphoresis.  HENT: Negative for hearing loss, ear pain, nosebleeds, congestion, sore throat, neck pain, tinnitus and ear discharge.   Eyes: Negative for blurred vision, double vision, photophobia, pain, discharge and redness.  Respiratory: Negative for cough, hemoptysis, sputum production, shortness of breath, wheezing and stridor.   Cardiovascular: Negative for chest pain, palpitations, orthopnea, claudication, leg swelling and PND.  Gastrointestinal: Positive for abdominal pain. Negative for heartburn, nausea, vomiting, diarrhea, constipation, blood in stool and melena.   Genitourinary: Negative for dysuria, urgency, frequency, hematuria and flank pain.  Musculoskeletal: Negative for myalgias, back pain, joint pain and falls.  Skin: Negative for itching and rash.  Neurological: Negative for dizziness, tingling, tremors, sensory change, speech change, focal weakness, seizures, loss of consciousness, weakness and headaches.  Endo/Heme/Allergies: Negative for environmental allergies and polydipsia. Does not bruise/bleed easily.  Psychiatric/Behavioral: Negative for depression, suicidal ideas, hallucinations, memory loss and substance abuse. The patient is not nervous/anxious and does not have insomnia.      PHYSICAL EXAM:  Blood pressure 135/81, pulse 81, temperature 98.5 F (36.9 C), temperature source Oral, resp. rate 14, last menstrual period 11/26/2016, SpO2 96 %.    Vitals reviewed. Constitutional: She is oriented to person, place, and time. She appears well-developed and well-nourished.  HENT:  Head: Normocephalic and atraumatic.  Right Ear: External ear normal.  Left Ear: External ear normal.  Nose: Nose normal.  Mouth/Throat: Oropharynx is clear and moist.  Eyes: Conjunctivae and EOM are normal. Pupils are equal, round, and reactive to light. Right eye exhibits no discharge. Left eye exhibits no discharge. No scleral icterus.  Neck: Normal range of motion. Neck supple. No tracheal deviation present. No thyromegaly present.  Cardiovascular: Normal rate, regular rhythm, normal heart sounds and intact distal pulses.  Exam reveals no gallop and no friction rub.   No murmur heard. Respiratory: Effort normal and breath sounds normal. No respiratory distress. She has no wheezes. She has no rales. She exhibits no tenderness.  GI: Soft. Bowel sounds are normal. She exhibits no distension and no mass. There is tenderness. There is no rebound and no guarding.  Genitourinary:       Vulva is normal without lesions Vagina is pink moist without  discharge Cervix normal in appearance and pap is normal Uterus is enlarged 14 weeks size fibroids Adnexa is negative with normal sized ovaries by sonogram  Musculoskeletal: Normal range of motion. She exhibits no edema and no tenderness.  Neurological: She is alert and oriented to person, place, and time. She has normal reflexes. She displays normal reflexes. No cranial nerve deficit. She exhibits normal muscle tone. Coordination normal.  Skin: Skin is warm and dry. No rash noted. No erythema. No pallor.  Psychiatric: She has a normal mood and affect. Her behavior is normal. Judgment and thought content normal.    Labs: Results for orders placed or performed during the hospital encounter of 12/22/16 (from the past 336 hour(s))  Glucose, capillary   Collection Time: 12/22/16 10:48 AM  Result Value Ref Range   Glucose-Capillary 142 (H) 65 - 99 mg/dL  Results for orders placed or performed during the hospital  encounter of 12/17/16 (from the past 336 hour(s))  Hemoglobin A1c   Collection Time: 12/17/16  8:17 AM  Result Value Ref Range   Hgb A1c MFr Bld 7.8 (H) 4.8 - 5.6 %   Mean Plasma Glucose 177.16 mg/dL  CBC   Collection Time: 12/17/16  8:17 AM  Result Value Ref Range   WBC 9.6 4.0 - 10.5 K/uL   RBC 4.85 3.87 - 5.11 MIL/uL   Hemoglobin 13.1 12.0 - 15.0 g/dL   HCT 40.0 36.0 - 46.0 %   MCV 82.5 78.0 - 100.0 fL   MCH 27.0 26.0 - 34.0 pg   MCHC 32.8 30.0 - 36.0 g/dL   RDW 13.6 11.5 - 15.5 %   Platelets 474 (H) 150 - 400 K/uL  Comprehensive metabolic panel   Collection Time: 12/17/16  8:17 AM  Result Value Ref Range   Sodium 137 135 - 145 mmol/L   Potassium 3.3 (L) 3.5 - 5.1 mmol/L   Chloride 102 101 - 111 mmol/L   CO2 23 22 - 32 mmol/L   Glucose, Bld 141 (H) 65 - 99 mg/dL   BUN 10 6 - 20 mg/dL   Creatinine, Ser 0.72 0.44 - 1.00 mg/dL   Calcium 9.1 8.9 - 10.3 mg/dL   Total Protein 7.7 6.5 - 8.1 g/dL   Albumin 4.2 3.5 - 5.0 g/dL   AST 29 15 - 41 U/L   ALT 32 14 - 54 U/L    Alkaline Phosphatase 67 38 - 126 U/L   Total Bilirubin 0.2 (L) 0.3 - 1.2 mg/dL   GFR calc non Af Amer >60 >60 mL/min   GFR calc Af Amer >60 >60 mL/min   Anion gap 12 5 - 15  hCG, quantitative, pregnancy   Collection Time: 12/17/16  8:17 AM  Result Value Ref Range   hCG, Beta Chain, Quant, S 1 <5 mIU/mL  Rapid HIV screen (HIV 1/2 Ab+Ag)   Collection Time: 12/17/16  8:17 AM  Result Value Ref Range   HIV-1 P24 Antigen - HIV24 NON REACTIVE NON REACTIVE   HIV 1/2 Antibodies NON REACTIVE NON REACTIVE   Interpretation (HIV Ag Ab)      A non reactive test result means that HIV 1 or HIV 2 antibodies and HIV 1 p24 antigen were not detected in the specimen.  Urinalysis, Routine w reflex microscopic   Collection Time: 12/17/16  8:17 AM  Result Value Ref Range   Color, Urine YELLOW YELLOW   APPearance CLOUDY (A) CLEAR   Specific Gravity, Urine 1.010 1.005 - 1.030   pH 5.5 5.0 - 8.0   Glucose, UA NEGATIVE NEGATIVE mg/dL   Hgb urine dipstick LARGE (A) NEGATIVE   Bilirubin Urine NEGATIVE NEGATIVE   Ketones, ur NEGATIVE NEGATIVE mg/dL   Protein, ur TRACE (A) NEGATIVE mg/dL   Nitrite NEGATIVE NEGATIVE   Leukocytes, UA TRACE (A) NEGATIVE  Urinalysis, Microscopic (reflex)   Collection Time: 12/17/16  8:17 AM  Result Value Ref Range   RBC / HPF TOO NUMEROUS TO COUNT 0 - 5 RBC/hpf   WBC, UA 0-5 0 - 5 WBC/hpf   Bacteria, UA NONE SEEN NONE SEEN   Squamous Epithelial / LPF NONE SEEN NONE SEEN  Type and screen   Collection Time: 12/17/16  8:17 AM  Result Value Ref Range   ABO/RH(D) O POS    Antibody Screen POS    Sample Expiration 12/31/2016    Antibody Identification NO CLINICALLY SIGNIFICANT ANTIBODY IDENTIFIED    DAT, IgG  NEG Performed at [facility], [place] [address], [place],  [address]    Unit Number [id number]    Blood Component Type RBC LR PHER1    Unit division 00    Status of Unit REL FROM [facility]    Transfusion Status OK TO TRANSFUSE    Crossmatch Result  COMPATIBLE    Unit Number [id number]    Blood Component Type RBC LR PHER1    Unit division 00    Status of Unit REL FROM [facility]    Transfusion Status OK TO TRANSFUSE    Crossmatch Result COMPATIBLE   BPAM RBC   Collection Time: 12/17/16  8:17 AM  Result Value Ref Range   Blood Product Unit Number [id number]    Unit Type and Rh 5100    Blood Product Expiration Date [id number]    Blood Product Unit Number [id number]    Unit Type and Rh 5100    Blood Product Expiration Date 182993716967   Glucose, capillary   Collection Time: 12/17/16  8:26 AM  Result Value Ref Range   Glucose-Capillary 152 (H) 65 - 99 mg/dL    EKG: Orders placed or performed during the hospital encounter of 12/17/16  . EKG 12-Lead  . EKG 12-Lead    Imaging Studies: No results found.    Assessment: Patient Active Problem List   Diagnosis Date Noted  . Calculus of gallbladder with acute cholecystitis, without mention of obstruction 03/30/2012  . Depression 03/30/2012  . Sciatica 03/30/2012  . Severe obesity (BMI >= 40) ([place]) 03/30/2012  . BACK PAIN, LUMBAR 07/24/2008  . OTITIS MEDIA, ACUTE 07/08/2008  . CELLULITIS, FINGER 07/08/2008  . OBESITY 06/17/2008  . UPPER RESPIRATORY INFECTION, VIRAL 05/14/2008  . HYPERTENSION 06/07/2007  . FLUID RETENTION 06/02/2006  . ELEVATED BLOOD PRESSURE WITHOUT DIAGNOSIS OF HYPERTENSION 04/25/2006  . ANXIETY 04/22/2006  . DEPRESSION 04/22/2006  . MIGRAINE HEADACHE 04/22/2006  . GERD 04/22/2006    Plan: TAH BSO  Pt understands the risks of surgery including but not limited t  excessive bleeding requiring transfusion or reoperation, post-operative infection requiring prolonged hospitalization or re-hospitalization and antibiotic therapy, and damage to other organs including bladder, bowel, ureters and major vessels.  The patient also understands the alternative treatment options which were discussed in full.  All questions were answered.    EURE,LUTHER  H 12/22/2016 11:02 AM

## 2016-12-22 NOTE — Anesthesia Procedure Notes (Addendum)
Procedure Name: Intubation Date/Time: 12/22/2016 11:41 AM Performed by: Andree Elk, Amy A, CRNA Pre-anesthesia Checklist: Patient identified, Patient being monitored, Timeout performed, Emergency Drugs available and Suction available Patient Re-evaluated:Patient Re-evaluated prior to induction Oxygen Delivery Method: Circle System Utilized Preoxygenation: Pre-oxygenation with 100% oxygen Induction Type: IV induction Ventilation: Mask ventilation without difficulty Laryngoscope Size: Mac and 3 Grade View: Grade III Tube type: Oral Tube size: 7.0 mm Number of attempts: 3 Airway Equipment and Method: Bougie stylet and Stylet Placement Confirmation: ETT inserted through vocal cords under direct vision,  positive ETCO2 and breath sounds checked- equal and bilateral Secured at: 21 cm Tube secured with: Tape Dental Injury: Teeth and Oropharynx as per pre-operative assessment  Comments: DL x1 with Mac 3 grade 3 view; DL x1 with Miller 3 still grade 3 view; third attempt with MAC 3 and able to pass bougie through cords and slide ETT over; VSS throughout; easy mask airway; Dr.Schultz present throughout

## 2016-12-23 ENCOUNTER — Encounter (HOSPITAL_COMMUNITY): Payer: Self-pay | Admitting: Obstetrics & Gynecology

## 2016-12-23 LAB — CBC
HEMATOCRIT: 34.8 % — AB (ref 36.0–46.0)
Hemoglobin: 11 g/dL — ABNORMAL LOW (ref 12.0–15.0)
MCH: 26.5 pg (ref 26.0–34.0)
MCHC: 31.6 g/dL (ref 30.0–36.0)
MCV: 83.9 fL (ref 78.0–100.0)
PLATELETS: 452 10*3/uL — AB (ref 150–400)
RBC: 4.15 MIL/uL (ref 3.87–5.11)
RDW: 14 % (ref 11.5–15.5)
WBC: 15.7 10*3/uL — ABNORMAL HIGH (ref 4.0–10.5)

## 2016-12-23 LAB — BASIC METABOLIC PANEL
Anion gap: 9 (ref 5–15)
BUN: 9 mg/dL (ref 6–20)
CALCIUM: 8.3 mg/dL — AB (ref 8.9–10.3)
CO2: 26 mmol/L (ref 22–32)
CREATININE: 0.65 mg/dL (ref 0.44–1.00)
Chloride: 99 mmol/L — ABNORMAL LOW (ref 101–111)
GFR calc Af Amer: >60 mL/min (ref >60)
GFR calc non Af Amer: >60 mL/min (ref >60)
GLUCOSE: 227 mg/dL — AB (ref 65–99)
Potassium: 4 mmol/L (ref 3.5–5.1)
Sodium: 134 mmol/L — ABNORMAL LOW (ref 135–145)

## 2016-12-23 MED ORDER — METFORMIN HCL ER (MOD) 1000 MG PO TB24: 1000.0000 mg | ORAL_TABLET | Freq: Two times a day (BID) | ORAL | 11 refills | Status: DC

## 2016-12-23 MED ORDER — OXYCODONE-ACETAMINOPHEN 7.5-325 MG PO TABS: 1.0000 | ORAL_TABLET | ORAL | 0 refills | Status: DC | PRN

## 2016-12-23 MED ORDER — KETOROLAC TROMETHAMINE 10 MG PO TABS: 10.0000 mg | ORAL_TABLET | Freq: Three times a day (TID) | ORAL | 0 refills | Status: DC | PRN

## 2016-12-23 MED ORDER — PROMETHAZINE HCL 25 MG PO TABS: 25.0000 mg | ORAL_TABLET | Freq: Four times a day (QID) | ORAL | 1 refills | Status: DC | PRN

## 2016-12-23 NOTE — Progress Notes (Signed)
Pt is being d/c'd home per order.  Spouse is at bedside.  She is d/c'd via w/c and private automobile.  D/C orders reviewed with patient.  Review of F/U, Restrictions and medications was completed.  Pt verbalizes understanding.  She is discharged in stable condition.

## 2016-12-23 NOTE — Progress Notes (Signed)
Inpatient Diabetes Program Recommendations  AACE/ADA: New Consensus Statement on Inpatient Glycemic Control (2015)  Target Ranges:  Prepandial:   less than 140 mg/dL      Peak postprandial:   less than 180 mg/dL (1-2 hours)      Critically ill patients:  140 - 180 mg/dL   Results for KEYIA, MORETTO (MRN [id number]) as of 12/23/2016 07:49  Ref. Range 12/23/2016 04:29  Glucose Latest Ref Range: 65 - 99 mg/dL 227 (H)  Results for TANISH, SINKLER (MRN [id number]) as of 12/23/2016 07:49  Ref. Range 12/22/2016 10:48 12/22/2016 13:38  Glucose-Capillary Latest Ref Range: 65 - 99 mg/dL 142 (H) 187 (H)   Review of Glycemic Control  Diabetes history: DM2 Outpatient Diabetes medications: Metformin XR 500 mg BID Current orders for Inpatient glycemic control: Metformin XR 500 mg BID  Inpatient Diabetes Program Recommendations:  Correction (SSI): While inpatient, please consider ordering CBGs with Novololg correction scale ACHS.  Thanks, Barnie Alderman, RN, MSN, CDE Diabetes Coordinator Inpatient Diabetes Program [phone] (Team Pager from 8am to 5pm)

## 2016-12-23 NOTE — Progress Notes (Signed)
Removed foley 11/7 P.M. Per order, pt has urinated with no complaints. Will continue to monitor.

## 2016-12-23 NOTE — Discharge Summary (Signed)
Physician Discharge Summary  Patient ID: Yvonne Mckee MRN: [id number] DOB/AGE: 1965-11-06 51 y.o.  Admit date: 12/22/2016 Discharge date: 12/23/2016  Admission Diagnoses: Menometrorrhagia, dysmenorrhea, 14 weeks size fibroid uterus  Discharge Diagnoses:  Active Problems:   S/P TAH-BSO   Discharged Condition: good  Hospital Course: unremarkable post op course  Consults: None  Significant Diagnostic Studies: labs:  Results for orders placed or performed during the hospital encounter of 12/22/16 (from the past 24 hour(s))  Glucose, capillary   Collection Time: 12/22/16  1:38 PM  Result Value Ref Range   Glucose-Capillary 187 (H) 65 - 99 mg/dL  CBC   Collection Time: 12/23/16  4:29 AM  Result Value Ref Range   WBC 15.7 (H) 4.0 - 10.5 K/uL   RBC 4.15 3.87 - 5.11 MIL/uL   Hemoglobin 11.0 (L) 12.0 - 15.0 g/dL   HCT 34.8 (L) 36.0 - 46.0 %   MCV 83.9 78.0 - 100.0 fL   MCH 26.5 26.0 - 34.0 pg   MCHC 31.6 30.0 - 36.0 g/dL   RDW 14.0 11.5 - 15.5 %   Platelets 452 (H) 150 - 400 K/uL  Basic metabolic panel   Collection Time: 12/23/16  4:29 AM  Result Value Ref Range   Sodium 134 (L) 135 - 145 mmol/L   Potassium 4.0 3.5 - 5.1 mmol/L   Chloride 99 (L) 101 - 111 mmol/L   CO2 26 22 - 32 mmol/L   Glucose, Bld 227 (H) 65 - 99 mg/dL   BUN 9 6 - 20 mg/dL   Creatinine, Ser 0.65 0.44 - 1.00 mg/dL   Calcium 8.3 (L) 8.9 - 10.3 mg/dL   GFR calc non Af Amer >60 >60 mL/min   GFR calc Af Amer >60 >60 mL/min   Anion gap 9 5 - 15     Treatments: surgery: TAH BSO  Discharge Exam: Blood pressure 132/70, pulse 84, temperature 98.1 F (36.7 C), temperature source Oral, resp. rate 20, last menstrual period 11/26/2016, SpO2 96 %. General appearance: alert, cooperative and no distress GI: soft, non-tender; bowel sounds normal; no masses,  no organomegaly Incision/Wound:clean dry intact  Disposition: 01-Home or Self Care  Discharge Instructions    Call MD for:  persistant nausea and  vomiting   Complete by:  As directed    Call MD for:  severe uncontrolled pain   Complete by:  As directed    Call MD for:  temperature >100.4   Complete by:  As directed    Diet - low sodium heart healthy   Complete by:  As directed    Driving Restrictions   Complete by:  As directed    Do not drive until instructed to do so   Increase activity slowly   Complete by:  As directed    Leave dressing on - Keep it clean, dry, and intact until clinic visit   Complete by:  As directed    Lifting restrictions   Complete by:  As directed    Do not lift more than 10 pounds for 6 weeks   Sexual Activity Restrictions   Complete by:  As directed    Do not put anything in your vagina for 6 weeks, and don't let anybody else either!     Allergies as of 12/23/2016      Reactions   Bupropion Hcl Other (See Comments)   Unknown reaction: Pt states that she remembers welts post taking medication   Squid Oil Swelling   [employer] Wort  Rash      Medication List    STOP taking these medications   ibuprofen 200 MG tablet Commonly known as:  ADVIL,MOTRIN   megestrol 40 MG tablet Commonly known as:  MEGACE     TAKE these medications   aspirin 81 MG chewable tablet Chew 81 mg by mouth at bedtime.   cyclobenzaprine 10 MG tablet Commonly known as:  FLEXERIL Take 10 mg by mouth at bedtime.   diphenhydrAMINE 25 mg capsule Commonly known as:  BENADRYL Take 50 mg by mouth at bedtime.   FLUoxetine 40 MG capsule Commonly known as:  PROZAC Take 40 mg by mouth at bedtime.   hydrochlorothiazide 25 MG tablet Commonly known as:  HYDRODIURIL Take 25 mg by mouth daily.   ketorolac 10 MG tablet Commonly known as:  TORADOL Take 1 tablet (10 mg total) every 8 (eight) hours as needed by mouth.   lansoprazole 15 MG capsule Commonly known as:  PREVACID Take 15 mg by mouth daily.   metFORMIN 1000 MG (MOD) 24 hr tablet Commonly known as:  GLUMETZA Take 1 tablet (1,000 mg total) 2 (two) times daily  by mouth. What changed:    medication strength  how much to take   OVER THE COUNTER MEDICATION Take 1 capsule by mouth daily. Vital Core Nutrition Supplement   oxyCODONE-acetaminophen 7.5-325 MG tablet Commonly known as:  PERCOCET Take 1-2 tablets every 4 (four) hours as needed by mouth for moderate pain.   promethazine 25 MG tablet Commonly known as:  PHENERGAN Take 1 tablet (25 mg total) every 6 (six) hours as needed by mouth for nausea.      Follow-up Information    Florian Buff, MD Follow up in 1 week(s).   Specialties:  Obstetrics and Gynecology, Radiology Why:  post op visit Contact information: [address] [place] [address] [phone]           Signed: Florian Buff 12/23/2016, 1:13 PM

## 2016-12-23 NOTE — Care Management Note (Signed)
dCase Management Note  Patient Details  Name: Yvonne Mckee MRN: [id number] Date of Birth: 26-Jan-1966  Subjective/Objective:                 S/p TAH-BSO chart reviewed for CM needs. Pt from home, ind, has no insurance but has MD to f/u with. No medication assistance with narcotics and phenergen is on $4 list. No HH or DME needs noted.   Action/Plan: DC home today with self care. No CM needs noted at DC.   Expected Discharge Date:  12/23/16               Expected Discharge Plan:  Home/Self Care  In-House Referral:  NA  Discharge planning Services  NA  Post Acute Care Choice:  NA Choice offered to:  NA  DME Arranged:    DME Agency:     HH Arranged:    HH Agency:     Status of Service:  Completed, signed off  If discussed at [employer] of Stay Meetings, dates discussed:    Additional Comments:  Sherald Barge, RN 12/23/2016, 1:26 PM

## 2016-12-23 NOTE — Discharge Instructions (Signed)
Abdominal Hysterectomy, Care After °This sheet gives you information about how to care for yourself after your procedure. Your health care provider may also give you more specific instructions. If you have problems or questions, contact your health care provider. °What can I expect after the procedure? °After your procedure, it is common to have: °· Pain. °· Fatigue. °· Poor appetite. °· Less interest in sex. °· Vaginal bleeding and discharge. You may need to use a sanitary napkin after this procedure. ° °Follow these instructions at home: °Bathing °· Do not take baths, swim, or use a hot tub until your health care provider approves. Ask your health care provider if you can take showers. You may only be allowed to take sponge baths for bathing. °· Keep the bandage (dressing) dry until your health care provider says it can be removed. °Incision care °· Follow instructions from your health care provider about how to take care of your incision. Make sure you: °? Wash your hands with soap and water before you change your bandage (dressing). If soap and water are not available, use hand sanitizer. °? Change your dressing as told by your health care provider. °? Leave stitches (sutures), skin glue, or adhesive strips in place. These skin closures may need to stay in place for 2 weeks or longer. If adhesive strip edges start to loosen and curl up, you may trim the loose edges. Do not remove adhesive strips completely unless your health care provider tells you to do that. °· Check your incision area every day for signs of infection. Check for: °? Redness, swelling, or pain. °? Fluid or blood. °? Warmth. °? Pus or a bad smell. °Activity °· Do gentle, daily exercises as told by your health care provider. You may be told to take short walks every day and go farther each time. °· Do not lift anything that is heavier than 10 lb (4.5 kg), or the limit that your health care provider tells you, until he or she says that it is  safe. °· Do not drive or use heavy machinery while taking prescription pain medicine. °· Do not drive for 24 hours if you were given a medicine to help you relax (sedative). °· Follow your health care provider's instructions about exercise, driving, and general activities. Ask your health care provider what activities are safe for you. °Lifestyle °· Do not douche, use tampons, or have sex for at least 6 weeks or as told by your health care provider. °· Do not drink alcohol until your health care provider approves. °· Drink enough fluid to keep your urine clear or pale yellow. °· Try to have someone at home with you for the first 1-2 weeks to help. °· Do not use any products that contain nicotine or tobacco, such as cigarettes and e-cigarettes. These can delay healing. If you need help quitting, ask your health care provider. °General instructions °· Take over-the-counter and prescription medicines only as told by your health care provider. °· Do not take aspirin or ibuprofen. These medicines can cause bleeding. °· To prevent or treat constipation while you are taking prescription pain medicine, your health care provider may recommend that you: °? Drink enough fluid to keep your urine clear or pale yellow. °? Take over-the-counter or prescription medicines. °? Eat foods that are high in fiber, such as fresh fruits and vegetables, whole grains, and beans. °? Limit foods that are high in fat and processed sugars, such as fried and sweet foods. °· Keep all   follow-up visits as told by your health care provider. This is important. °Contact a health care provider if: °· You have chills or fever. °· You have redness, swelling, or pain around your incision. °· You have fluid or blood coming from your incision. °· Your incision feels warm to the touch. °· You have pus or a bad smell coming from your incision. °· Your incision breaks open. °· You feel dizzy or light-headed. °· You have pain or bleeding when you urinate. °· You  have persistent diarrhea. °· You have persistent nausea and vomiting. °· You have abnormal vaginal discharge. °· You have a rash. °· You have any type of abnormal reaction or you develop an allergy to your medicine. °· Your pain medicine does not help. °Get help right away if: °· You have a fever and your symptoms suddenly get worse. °· You have severe abdominal pain. °· You have shortness of breath. °· You faint. °· You have pain, swelling, or redness in your leg. °· You have heavy vaginal bleeding with blood clots. °Summary °· After your procedure, it is common to have pain, fatigue and vaginal discharge. °· Do not take baths, swim, or use a hot tub until your health care provider approves. Ask your health care provider if you can take showers. You may only be allowed to take sponge baths for bathing. °· Follow your health care provider's instructions about exercise, driving, and general activities. Ask your health care provider what activities are safe for you. °· Do not lift anything that is heavier than 10 lb (4.5 kg), or the limit that your health care provider tells you, until he or she says that it is safe. °· Try to have someone at home with you for the first 1-2 weeks to help. °This information is not intended to replace advice given to you by your health care provider. Make sure you discuss any questions you have with your health care provider. °Document Released: 08/21/2004 Document Revised: 01/21/2016 Document Reviewed: 01/21/2016 °Elsevier Interactive Patient Education © 2017 [employer] ° °

## 2016-12-28 ENCOUNTER — Ambulatory Visit (INDEPENDENT_AMBULATORY_CARE_PROVIDER_SITE_OTHER): Payer: Self-pay | Admitting: Obstetrics & Gynecology

## 2016-12-28 ENCOUNTER — Encounter: Payer: Self-pay | Admitting: Obstetrics & Gynecology

## 2016-12-28 VITALS — BP 136/84 | HR 98 | Ht 62.0 in | Wt 198.0 lb

## 2016-12-28 DIAGNOSIS — Z90710 Acquired absence of both cervix and uterus: Secondary | ICD-10-CM

## 2016-12-28 DIAGNOSIS — Z9889 Other specified postprocedural states: Secondary | ICD-10-CM

## 2016-12-28 DIAGNOSIS — Z9079 Acquired absence of other genital organ(s): Secondary | ICD-10-CM

## 2016-12-28 DIAGNOSIS — Z90722 Acquired absence of ovaries, bilateral: Secondary | ICD-10-CM

## 2016-12-28 MED ORDER — OXYCODONE-ACETAMINOPHEN 5-325 MG PO TABS: 1.0000 | ORAL_TABLET | ORAL | 0 refills | Status: DC | PRN

## 2016-12-28 MED ORDER — ESTRADIOL 0.1 MG/24HR TD PTTW
1.0000 | MEDICATED_PATCH | TRANSDERMAL | 12 refills | Status: DC
Start: 2016-12-30 — End: 2017-07-26

## 2016-12-28 NOTE — Progress Notes (Signed)
  HPI: Patient returns for routine postoperative follow-up having undergone TAH BSO on 12/22/2016.  The patient's immediate postoperative recovery has been unremarkable. Since hospital discharge the patient reports incisional pain, post op constipation.   Current Outpatient Medications: aspirin 81 MG chewable tablet, Chew 81 mg by mouth at bedtime. , Disp: , Rfl:  cyclobenzaprine (FLEXERIL) 10 MG tablet, Take 10 mg by mouth at bedtime. , Disp: , Rfl:  diphenhydrAMINE (BENADRYL) 25 mg capsule, Take 50 mg by mouth at bedtime., Disp: , Rfl:  FLUoxetine (PROZAC) 40 MG capsule, Take 40 mg by mouth at bedtime. , Disp: , Rfl:  hydrochlorothiazide (HYDRODIURIL) 25 MG tablet, Take 25 mg by mouth daily., Disp: , Rfl:  ketorolac (TORADOL) 10 MG tablet, Take 1 tablet (10 mg total) every 8 (eight) hours as needed by mouth., Disp: 15 tablet, Rfl: 0 lansoprazole (PREVACID) 15 MG capsule, Take 15 mg by mouth daily. , Disp: , Rfl:  metFORMIN (GLUMETZA) 1000 MG (MOD) 24 hr tablet, Take 1 tablet (1,000 mg total) 2 (two) times daily by mouth., Disp: 60 tablet, Rfl: 11 oxyCODONE-acetaminophen (PERCOCET) 7.5-325 MG tablet, Take 1-2 tablets every 4 (four) hours as needed by mouth for moderate pain., Disp: 40 tablet, Rfl: 0 promethazine (PHENERGAN) 25 MG tablet, Take 1 tablet (25 mg total) every 6 (six) hours as needed by mouth for nausea., Disp: 30 tablet, Rfl: 1 OVER THE COUNTER MEDICATION, Take 1 capsule by mouth daily. Vital Core Nutrition Supplement, Disp: , Rfl:  oxyCODONE-acetaminophen (ROXICET) 5-325 MG tablet, Take 1-2 tablets every 4 (four) hours as needed by mouth for severe pain., Disp: 30 tablet, Rfl: 0  No current facility-administered medications for this visit.     Blood pressure 136/84, pulse 98, height 5\' 2"  (1.575 m), weight 198 lb (89.8 kg), last menstrual period 11/26/2016.  Physical Exam: Incisional clean dry intaact Abdomen is soft  Diagnostic  Tests:   Pathology: benign  Impression: S/P TAH BSO  Plan: Begin vivelle dot patch  Follow up: 4  weeks  Florian Buff, MD

## 2016-12-28 NOTE — Progress Notes (Signed)
See below

## 2017-01-04 ENCOUNTER — Telehealth: Payer: Self-pay | Admitting: Obstetrics & Gynecology

## 2017-01-04 ENCOUNTER — Telehealth: Payer: Self-pay | Admitting: *Deleted

## 2017-01-04 MED ORDER — OXYCODONE-ACETAMINOPHEN 5-325 MG PO TABS: 1.0000 | ORAL_TABLET | ORAL | 0 refills | Status: DC | PRN

## 2017-01-04 NOTE — Telephone Encounter (Signed)
Pt called requesting a refill on her percocet. Informed pt that I would send her request to Dr Elonda Husky and we would call her if it was available to be picked up.

## 2017-01-04 NOTE — Telephone Encounter (Signed)
Informed patient pain medication refilled and sent to pharmacy. Pt verbalized understanding.

## 2017-01-07 ENCOUNTER — Other Ambulatory Visit: Payer: Self-pay

## 2017-01-07 ENCOUNTER — Encounter (HOSPITAL_COMMUNITY): Payer: Self-pay | Admitting: Emergency Medicine

## 2017-01-07 ENCOUNTER — Emergency Department (HOSPITAL_COMMUNITY)
Admission: EM | Admit: 2017-01-07 | Discharge: 2017-01-07 | Disposition: A | Discharge status: Home / Self Care | Payer: Self-pay | Attending: Emergency Medicine | Admitting: Emergency Medicine

## 2017-01-07 DIAGNOSIS — Z79899 Other long term (current) drug therapy: Secondary | ICD-10-CM | POA: Insufficient documentation

## 2017-01-07 DIAGNOSIS — T8149XA Infection following a procedure, other surgical site, initial encounter: Secondary | ICD-10-CM | POA: Insufficient documentation

## 2017-01-07 DIAGNOSIS — Y658 Other specified misadventures during surgical and medical care: Secondary | ICD-10-CM | POA: Insufficient documentation

## 2017-01-07 DIAGNOSIS — E119 Type 2 diabetes mellitus without complications: Secondary | ICD-10-CM | POA: Insufficient documentation

## 2017-01-07 DIAGNOSIS — Z7982 Long term (current) use of aspirin: Secondary | ICD-10-CM | POA: Insufficient documentation

## 2017-01-07 DIAGNOSIS — I10 Essential (primary) hypertension: Secondary | ICD-10-CM | POA: Insufficient documentation

## 2017-01-07 DIAGNOSIS — L0291 Cutaneous abscess, unspecified: Secondary | ICD-10-CM | POA: Insufficient documentation

## 2017-01-07 LAB — CBG MONITORING, ED: GLUCOSE-CAPILLARY: 101 mg/dL — AB (ref 65–99)

## 2017-01-07 MED ORDER — LIDOCAINE-EPINEPHRINE (PF) 2 %-1:200000 IJ SOLN
10.0000 mL | Freq: Once | INTRAMUSCULAR | Status: AC
  Administered 2017-01-07: 10 mL
  Filled 2017-01-07: qty 20

## 2017-01-07 MED ORDER — SULFAMETHOXAZOLE-TRIMETHOPRIM 800-160 MG PO TABS
1.0000 | ORAL_TABLET | Freq: Once | ORAL | Status: AC
  Administered 2017-01-07: 1 via ORAL
  Filled 2017-01-07: qty 1

## 2017-01-07 MED ORDER — OXYCODONE-ACETAMINOPHEN 5-325 MG PO TABS
1.0000 | ORAL_TABLET | Freq: Once | ORAL | Status: AC
  Administered 2017-01-07: 1 via ORAL
  Filled 2017-01-07: qty 1

## 2017-01-07 MED ORDER — SULFAMETHOXAZOLE-TRIMETHOPRIM 800-160 MG PO TABS: 1.0000 | ORAL_TABLET | Freq: Two times a day (BID) | ORAL | 0 refills | Status: AC

## 2017-01-07 MED ORDER — POVIDONE-IODINE 10 % EX SOLN
CUTANEOUS | Status: AC
  Administered 2017-01-07: 20:00:00
  Filled 2017-01-07: qty 15

## 2017-01-07 NOTE — ED Triage Notes (Signed)
Pt had hysterectomy nov 7th. Fu nov 13 and all well. Pt noted knot to left side lower abd where incision was a few days ago. It is now red and hard. Area is red and hard to touch. Pt very tender.

## 2017-01-07 NOTE — ED Provider Notes (Signed)
[facility] EMERGENCY DEPARTMENT Provider Note   CSN: [id number] Arrival date & time: 01/07/17  1711     History   Chief Complaint Chief Complaint  Patient presents with  . Abscess    HPI Yvonne Mckee is a 51 y.o. female.  Pt presents to the ED today with a hard knot along her hysterectomy incision site.  The pt had a hysterectomy on 11/7.  She had her post op follow up on 11/13 and the wound looked ok.  The pt noticed a red knot to her incision a few days ago.  It has gotten more swollen and painful.  The pt denies any fevers.        Past Medical History:  Diagnosis Date  . Anemia   . Anxiety   . Calculus of gallbladder with acute cholecystitis, without mention of obstruction 03/30/2012  . Depression   . Depression 03/30/2012  . Diabetes mellitus without complication ([place])   . GERD (gastroesophageal reflux disease)   . Hypertension   . Menometrorrhagia   . Sciatica   . Sciatica 03/30/2012  . Severe obesity (BMI >= 40) ([place]) 03/30/2012    Patient Active Problem List   Diagnosis Date Noted  . S/P TAH-BSO 12/22/2016  . Calculus of gallbladder with acute cholecystitis, without mention of obstruction 03/30/2012  . Depression 03/30/2012  . Sciatica 03/30/2012  . Severe obesity (BMI >= 40) ([place]) 03/30/2012  . BACK PAIN, LUMBAR 07/24/2008  . OTITIS MEDIA, ACUTE 07/08/2008  . CELLULITIS, FINGER 07/08/2008  . OBESITY 06/17/2008  . UPPER RESPIRATORY INFECTION, VIRAL 05/14/2008  . HYPERTENSION 06/07/2007  . FLUID RETENTION 06/02/2006  . ELEVATED BLOOD PRESSURE WITHOUT DIAGNOSIS OF HYPERTENSION 04/25/2006  . ANXIETY 04/22/2006  . DEPRESSION 04/22/2006  . MIGRAINE HEADACHE 04/22/2006  . GERD 04/22/2006    Past Surgical History:  Procedure Laterality Date  . ABDOMINAL HYSTERECTOMY N/A 12/22/2016   Procedure: HYSTERECTOMY ABDOMINAL;  Surgeon: Florian Buff, MD;  Location: [facility];  Service: Gynecology;  Laterality: N/A;  . CESAREAN SECTION     x 2  .  CHOLECYSTECTOMY N/A 03/29/2012   Procedure: LAPAROSCOPIC CHOLECYSTECTOMY WITH INTRAOPERATIVE CHOLANGIOGRAM;  Surgeon: Joyice Faster. Cornett, MD;  Location: [facility];  Service: General;  Laterality: N/A;  . SALPINGOOPHORECTOMY Bilateral 12/22/2016   Procedure: BILATERAL SALPINGO OOPHORECTOMY;  Surgeon: Florian Buff, MD;  Location: [facility];  Service: Gynecology;  Laterality: Bilateral;  . TONSILLECTOMY    . TUBAL LIGATION      OB History    Gravida Para Term Preterm AB Living   2 2 2     2    SAB TAB Ectopic Multiple Live Births           2       Home Medications    Prior to Admission medications   Medication Sig Start Date End Date Taking? Authorizing Provider  aspirin 81 MG chewable tablet Chew 81 mg by mouth at bedtime.    Yes [provider]  cyclobenzaprine (FLEXERIL) 10 MG tablet Take 10 mg by mouth at bedtime.    Yes [provider]  diphenhydrAMINE (BENADRYL) 25 mg capsule Take 50 mg by mouth at bedtime.   Yes [provider]  estradiol (VIVELLE-DOT) 0.1 MG/24HR patch Place 1 patch (0.1 mg total) 2 (two) times a week onto the skin. 12/30/16  Yes Florian Buff, MD  FLUoxetine (PROZAC) 40 MG capsule Take 40 mg by mouth at bedtime.    Yes [provider]  hydrochlorothiazide (HYDRODIURIL)  25 MG tablet Take 25 mg by mouth daily.   Yes [provider]  ketorolac (TORADOL) 10 MG tablet Take 1 tablet (10 mg total) every 8 (eight) hours as needed by mouth. 12/23/16  Yes Florian Buff, MD  lansoprazole (PREVACID) 15 MG capsule Take 15 mg by mouth daily.    Yes [provider]  metFORMIN (GLUMETZA) 1000 MG (MOD) 24 hr tablet Take 1 tablet (1,000 mg total) 2 (two) times daily by mouth. 12/23/16  Yes Florian Buff, MD  Multiple Vitamins-Minerals (MULTIVITAL PO) Take 1 capsule by mouth daily.   Yes [provider]  OVER THE COUNTER MEDICATION Take 1 capsule by mouth daily. Vital Core Nutrition Supplement   Yes [provider]    oxyCODONE-acetaminophen (ROXICET) 5-325 MG tablet Take 1-2 tablets by mouth every 4 (four) hours as needed for severe pain. 01/04/17  Yes Florian Buff, MD  promethazine (PHENERGAN) 25 MG tablet Take 1 tablet (25 mg total) every 6 (six) hours as needed by mouth for nausea. 12/23/16  Yes Florian Buff, MD  Simethicone (GAS-X PO) Take 1 tablet by mouth daily.   Yes [provider]  oxyCODONE-acetaminophen (PERCOCET) 7.5-325 MG tablet Take 1-2 tablets every 4 (four) hours as needed by mouth for moderate pain. Patient not taking: Reported on 01/07/2017 12/23/16   Florian Buff, MD  sulfamethoxazole-trimethoprim (BACTRIM DS,SEPTRA DS) 800-160 MG tablet Take 1 tablet by mouth 2 (two) times daily for 7 days. 01/07/17 01/14/17  Isla Pence, MD    Family History Family History  Problem Relation Age of Onset  . Asthma Daughter   . Diabetes Maternal Grandmother   . Hypertension Maternal Grandmother   . Congestive Heart Failure Maternal Grandmother   . Emphysema Maternal Grandmother   . Cancer Maternal Grandmother        lung  . Other Father        heart problems  . Hypertension Mother   . Other Mother        hardening of heart  . Hypertension Brother   . Diabetes Brother   . Other Son        born with lypomengeocele    Social History Social History   Tobacco Use  . Smoking status: Never Smoker  . Smokeless tobacco: Never Used  Substance Use Topics  . Alcohol use: Yes    Comment: occasionally  . Drug use: No     Allergies   Bupropion hcl; Squid oil; and St johns wort   Review of Systems Review of Systems  Skin: Positive for wound.  All other systems reviewed and are negative.    Physical Exam Updated Vital Signs BP (!) 143/73 (BP Location: Left Arm)   Pulse 91   Temp 99 F (37.2 C) (Oral)   Resp 19   LMP 11/26/2016   SpO2 99%   Physical Exam  Constitutional: She is oriented to person, place, and time. She appears well-developed and well-nourished.   HENT:  Head: Normocephalic and atraumatic.  Right Ear: External ear normal.  Left Ear: External ear normal.  Nose: Nose normal.  Mouth/Throat: Oropharynx is clear and moist.  Eyes: Conjunctivae and EOM are normal. Pupils are equal, round, and reactive to light.  Neck: Normal range of motion. Neck supple.  Cardiovascular: Normal rate, regular rhythm, normal heart sounds and intact distal pulses.  Pulmonary/Chest: Effort normal and breath sounds normal.  Abdominal: Soft. Bowel sounds are normal.  Musculoskeletal: Normal range of motion.  Neurological: She  is alert and oriented to person, place, and time.  Skin:     Nursing note and vitals reviewed.    ED Treatments / Results  Labs (all labs ordered are listed, but only abnormal results are displayed) Labs Reviewed  CBG MONITORING, ED - Abnormal; Notable for the following components:      Result Value   Glucose-Capillary 101 (*)    All other components within normal limits  CBC WITH DIFFERENTIAL/PLATELET    EKG  EKG Interpretation None       Radiology No results found.  Procedures .Marland KitchenIncision and Drainage Date/Time: 01/07/2017 9:00 PM Performed by: Isla Pence, MD Authorized by: Isla Pence, MD   Consent:    Consent obtained:  Verbal   Consent given by:  Patient   Risks discussed:  Bleeding and incomplete drainage   Alternatives discussed:  No treatment Location:    Type:  Surgical wound infection   Size:  2 by 2   Location:  Trunk   Trunk location:  Abdomen Pre-procedure details:    Skin preparation:  Betadine Anesthesia (see MAR for exact dosages):    Anesthesia method:  Local infiltration   Local anesthetic:  Lidocaine 2% WITH epi Procedure type:    Complexity:  Simple Procedure details:    Surgical wound: surgical wound reopened     Incision types:  Single straight   Scalpel blade:  11   Wound management:  Probed and deloculated   Drainage:  Purulent and bloody   Drainage amount:   Moderate   Wound treatment:  Wound left open   Packing materials:  None Post-procedure details:    Patient tolerance of procedure:  Tolerated well, no immediate complications   (including critical care time)  Medications Ordered in ED Medications  sulfamethoxazole-trimethoprim (BACTRIM DS,SEPTRA DS) 800-160 MG per tablet 1 tablet (1 tablet Oral Given 01/07/17 2007)  oxyCODONE-acetaminophen (PERCOCET/ROXICET) 5-325 MG per tablet 1 tablet (1 tablet Oral Given 01/07/17 2007)  lidocaine-EPINEPHrine (XYLOCAINE W/EPI) 2 %-1:200000 (PF) injection 10 mL (10 mLs Infiltration Given by Other 01/07/17 2008)  povidone-iodine (BETADINE) 10 % external solution (  Given by Other 01/07/17 2020)     Initial Impression / Assessment and Plan / ED Course  I have reviewed the triage vital signs and the nursing notes.  Pertinent labs & imaging results that were available during my care of the patient were reviewed by me and considered in my medical decision making (see chart for details).  Pt started on bactrim and instructed to f/u with Dr. Elonda Husky.  Return if worse.  Final Clinical Impressions(s) / ED Diagnoses   Final diagnoses:  Abscess    ED Discharge Orders        Ordered    sulfamethoxazole-trimethoprim (BACTRIM DS,SEPTRA DS) 800-160 MG tablet  2 times daily     01/07/17 2044       Isla Pence, MD 01/07/17 2101

## 2017-01-08 ENCOUNTER — Inpatient Hospital Stay (HOSPITAL_COMMUNITY)
Admission: AD | Admit: 2017-01-08 | Discharge: 2017-01-08 | Disposition: A | Discharge status: Home / Self Care | Payer: Self-pay | Source: Ambulatory Visit | Attending: Obstetrics and Gynecology | Admitting: Obstetrics and Gynecology

## 2017-01-08 ENCOUNTER — Encounter: Payer: Self-pay | Admitting: Obstetrics and Gynecology

## 2017-01-08 ENCOUNTER — Telehealth: Payer: Self-pay | Admitting: Obstetrics and Gynecology

## 2017-01-08 DIAGNOSIS — Z6836 Body mass index (BMI) 36.0-36.9, adult: Secondary | ICD-10-CM | POA: Insufficient documentation

## 2017-01-08 DIAGNOSIS — R109 Unspecified abdominal pain: Secondary | ICD-10-CM

## 2017-01-08 DIAGNOSIS — Y838 Other surgical procedures as the cause of abnormal reaction of the patient, or of later complication, without mention of misadventure at the time of the procedure: Secondary | ICD-10-CM | POA: Insufficient documentation

## 2017-01-08 DIAGNOSIS — O902 Hematoma of obstetric wound: Secondary | ICD-10-CM

## 2017-01-08 DIAGNOSIS — S301XXA Contusion of abdominal wall, initial encounter: Secondary | ICD-10-CM | POA: Diagnosis present

## 2017-01-08 DIAGNOSIS — S3011XA Contusion of abdominal wall, initial encounter: Secondary | ICD-10-CM | POA: Diagnosis present

## 2017-01-08 DIAGNOSIS — S301XXD Contusion of abdominal wall, subsequent encounter: Secondary | ICD-10-CM

## 2017-01-08 DIAGNOSIS — E119 Type 2 diabetes mellitus without complications: Secondary | ICD-10-CM | POA: Insufficient documentation

## 2017-01-08 DIAGNOSIS — L7632 Postprocedural hematoma of skin and subcutaneous tissue following other procedure: Secondary | ICD-10-CM | POA: Insufficient documentation

## 2017-01-08 DIAGNOSIS — I10 Essential (primary) hypertension: Secondary | ICD-10-CM | POA: Insufficient documentation

## 2017-01-08 DIAGNOSIS — K219 Gastro-esophageal reflux disease without esophagitis: Secondary | ICD-10-CM | POA: Insufficient documentation

## 2017-01-08 MED ORDER — LIDOCAINE-EPINEPHRINE 1 %-1:100000 IJ SOLN
30.0000 mL | Freq: Once | INTRAMUSCULAR | Status: AC
  Administered 2017-01-08: 30 mL
  Filled 2017-01-08: qty 30

## 2017-01-08 MED ORDER — FENTANYL CITRATE 100 MCG BU TABS: 100.0000 ug | ORAL_TABLET | BUCCAL | Status: DC

## 2017-01-08 MED ORDER — LACTATED RINGERS IV SOLN
INTRAVENOUS | Status: DC
  Administered 2017-01-08: 22:00:00 via INTRAVENOUS

## 2017-01-08 MED ORDER — FENTANYL CITRATE (PF) 100 MCG/2ML IJ SOLN
100.0000 ug | Freq: Once | INTRAMUSCULAR | Status: AC
  Administered 2017-01-08: 100 ug via INTRAVENOUS
  Filled 2017-01-08: qty 2

## 2017-01-08 NOTE — MAU Note (Addendum)
Non pregnant pt who underwent TAB Hysterectomy on Nov 7 presents to triage for abd pain and bleeding to area. States went to [employer] last night and told pt she had an abscess that was drained. States pain and bleeding progressed.   Provider notified of pt's arrival.   2155: Provider at bs assessing pt and assessing abdominal area.   Fentanyl 115mcg given IvPush  2234: provider at bs for procedure to lancet incision for draining   1042: procedure start with 10 ml lidocaine 1% with epinephrine  1044: incision 1046:47ml more of 1%  lidocaine used  1055: procedure end time. Pt tolerated procedure well.   2308: Orders received to d/c pt home with followup and for pt to make appt on Monday.   2321: D/c instructions given with pt understanding. Pt made aware to call Monday to make appt to be seen for procedure done today. Pt verbalizes understanding. Pt left unit via ambulatory with SO.

## 2017-01-08 NOTE — Discharge Instructions (Signed)
Continue current antibiotics and pain meds. Continue hormone therapy , discuss moodiness at followup with [facility].

## 2017-01-08 NOTE — MAU Provider Note (Signed)
History     CSN: [id number]  Arrival date & time 01/08/17  2111   None     Chief Complaint  Patient presents with  . Abdominal Pain    incisional pain  bleeding and drainage from incision 3 wk after hysterectomy  HPI  This 51 yr female now 3+ weeks s/p abdominal hysterectomy was seen at [facility] ED last night for incision erythema, had I & D done via a small incision near the end of the hysterectomy incision, in the large pannus. Pt sent home on PO Bactrim. Pt developed brisk bleeding / drainage from the right end of the incision this PM, called the office and Call a nurse directed pt to [facility] , where we will perform I & D of the end of the hysterectomy incision. Past Medical History:  Diagnosis Date  . Anemia   . Anxiety   . Calculus of gallbladder with acute cholecystitis, without mention of obstruction 03/30/2012  . Depression   . Depression 03/30/2012  . Diabetes mellitus without complication ([employer])   . GERD (gastroesophageal reflux disease)   . Hypertension   . Menometrorrhagia   . Sciatica   . Sciatica 03/30/2012  . Severe obesity (BMI >= 40) ([place]) 03/30/2012    Past Surgical History:  Procedure Laterality Date  . ABDOMINAL HYSTERECTOMY N/A 12/22/2016   Procedure: HYSTERECTOMY ABDOMINAL;  Surgeon: Florian Buff, MD;  Location: [facility];  Service: Gynecology;  Laterality: N/A;  . CESAREAN SECTION     x 2  . CHOLECYSTECTOMY N/A 03/29/2012   Procedure: LAPAROSCOPIC CHOLECYSTECTOMY WITH INTRAOPERATIVE CHOLANGIOGRAM;  Surgeon: Joyice Faster. Cornett, MD;  Location: [facility];  Service: General;  Laterality: N/A;  . SALPINGOOPHORECTOMY Bilateral 12/22/2016   Procedure: BILATERAL SALPINGO OOPHORECTOMY;  Surgeon: Florian Buff, MD;  Location: [facility];  Service: Gynecology;  Laterality: Bilateral;  . TONSILLECTOMY    . TUBAL LIGATION      Family History  Problem Relation Age of Onset  . Asthma Daughter   . Diabetes Maternal Grandmother   . Hypertension Maternal Grandmother    . Congestive Heart Failure Maternal Grandmother   . Emphysema Maternal Grandmother   . Cancer Maternal Grandmother        lung  . Other Father        heart problems  . Hypertension Mother   . Other Mother        hardening of heart  . Hypertension Brother   . Diabetes Brother   . Other Son        born with lypomengeocele    Social History   Tobacco Use  . Smoking status: Never Smoker  . Smokeless tobacco: Never Used  Substance Use Topics  . Alcohol use: Yes    Comment: occasionally  . Drug use: No    OB History    Gravida Para Term Preterm AB Living   2 2 2     2    SAB TAB Ectopic Multiple Live Births           2      Review of Systems  Allergies  Bupropion hcl; Squid oil; and St johns wort  Home Medications    BP (!) 177/69 Comment: pt states agrevated over the current situation  Pulse (!) 110   Temp 99.6 F (37.6 C) (Oral)   Resp 20   Ht 5\' 2"  (1.575 m)   Wt 198 lb (89.8 kg)   LMP 11/26/2016   SpO2 97%  BMI 36.21 kg/m   Physical Exam  Constitutional: She appears well-developed and well-nourished.  Obese with pannus, with erythematous area left end of incision . 6 cm diameter. Tender to touch,  See pic.   Eyes: Pupils are equal, round, and reactive to light.  Neck: Neck supple.  Cardiovascular: Regular rhythm.  Pulmonary/Chest: Effort normal.  Abdominal: Soft. There is tenderness.  6 cm area of erythema and swelling at the left end of the abd incision. Incision intact, no current bleeding.   Site of prior I & D at [facility] ED is not easily identified. MAU Course  Procedures (including critical care time)  Labs Reviewed - No data to display No results found.  Incision and drainage of hysterectomy wound: Consent verbal IV meds : fentanyl 100 mcg Anesthesia Local x 30 cc lido 1% with epi #11 blade x 2 cm opening. Hemostat and gauze used to probe and evacuate a hematoma cavity 5 cm x 2  X 2 cm area in subQ fat. Some devascularized fat removed  along with hematoma. Iodoform 1/2 inch strip x 30 cm packed in cavity. Dressing applied    No diagnosis found. Wound hematoma.   MDM  Wound hematoma s/p hysterectomy , possibly infected.  Plan: continue antibiotics, and have [facility]

## 2017-01-08 NOTE — Telephone Encounter (Signed)
Patient seen last nite in ED [facility] for incision bleeding, had I & D of small area there.  Tonight having more bleeding , another site. Pt asked to be seen at [facility] for incision check.

## 2017-01-10 ENCOUNTER — Ambulatory Visit (INDEPENDENT_AMBULATORY_CARE_PROVIDER_SITE_OTHER): Payer: Self-pay | Admitting: Obstetrics & Gynecology

## 2017-01-10 ENCOUNTER — Encounter: Payer: Self-pay | Admitting: Obstetrics & Gynecology

## 2017-01-10 VITALS — BP 124/70 | HR 97 | Ht 62.0 in | Wt 195.0 lb

## 2017-01-10 DIAGNOSIS — Z9079 Acquired absence of other genital organ(s): Secondary | ICD-10-CM

## 2017-01-10 DIAGNOSIS — Z90710 Acquired absence of both cervix and uterus: Secondary | ICD-10-CM

## 2017-01-10 DIAGNOSIS — T8141XA Infection following a procedure, superficial incisional surgical site, initial encounter: Secondary | ICD-10-CM

## 2017-01-10 DIAGNOSIS — Z90722 Acquired absence of ovaries, bilateral: Secondary | ICD-10-CM

## 2017-01-10 MED ORDER — ESTRADIOL 2 MG PO TABS: 2.0000 mg | ORAL_TABLET | Freq: Every day | ORAL | 11 refills | Status: DC

## 2017-01-10 NOTE — Progress Notes (Signed)
.   Subjective:     Yvonne Mckee presents to the office 3 weeks following TAH BSO. Eating a regular diet without difficulty. Bowel movements are Normal.  Pain is controlled with current analgesics. Medications being used: percocet 2 tabs per day..    Objective:    BP 124/70 (BP Location: Left Arm, Patient Position: Sitting, Cuff Size: Normal)   Pulse 97   Ht 5\' 2"  (1.575 m)   Wt 195 lb (88.5 kg)   LMP 11/26/2016   BMI 35.67 kg/m   General:  alert, cooperative and no distress  Abdomen: soft, bowel sounds active, non-tender  Incision:   healing well with left edge of incision with probable staph vs MRSA infection, tender and red, packing removed and irrigated with in out cath and 1/2 peroxide/saline, repacked with a 4x4 and technicare, redressed     Assessment:      post op wound infection with probably Staph on bactrim DS, recheck in 2 days   Plan:    1. Continue any current medications. 2. Wound care discussed. 3. Pt is to increase activities as tolerated. 4. Follow up: 2 days for wound eval.

## 2017-01-11 ENCOUNTER — Other Ambulatory Visit: Payer: Self-pay | Admitting: Obstetrics & Gynecology

## 2017-01-11 MED ORDER — SILVER SULFADIAZINE 1 % EX CREA: 1.0000 "application " | TOPICAL_CREAM | Freq: Every day | CUTANEOUS | 0 refills | Status: DC

## 2017-01-12 ENCOUNTER — Encounter: Payer: Self-pay | Admitting: Obstetrics & Gynecology

## 2017-01-12 ENCOUNTER — Ambulatory Visit (INDEPENDENT_AMBULATORY_CARE_PROVIDER_SITE_OTHER): Payer: Self-pay | Admitting: Obstetrics & Gynecology

## 2017-01-12 VITALS — BP 120/72 | HR 90 | Ht 62.75 in | Wt 194.0 lb

## 2017-01-12 DIAGNOSIS — Z9079 Acquired absence of other genital organ(s): Secondary | ICD-10-CM

## 2017-01-12 DIAGNOSIS — Z9889 Other specified postprocedural states: Secondary | ICD-10-CM

## 2017-01-12 DIAGNOSIS — T8149XA Infection following a procedure, other surgical site, initial encounter: Secondary | ICD-10-CM

## 2017-01-12 DIAGNOSIS — Z90710 Acquired absence of both cervix and uterus: Secondary | ICD-10-CM

## 2017-01-12 DIAGNOSIS — Z90722 Acquired absence of ovaries, bilateral: Secondary | ICD-10-CM

## 2017-01-13 ENCOUNTER — Encounter: Payer: Self-pay | Admitting: Obstetrics & Gynecology

## 2017-01-13 NOTE — Progress Notes (Signed)
HPI: Patient returns for routine postoperative follow-up having undergone total abdominal hysterectomy with bilateral salpingo-oophorectomy on 12/22/2016.  The patient's immediate postoperative recovery has been unremarkable. Since hospital discharge the patient reports seeing her in the office for a abscess in the left corner of her incision  fourchette patient is a diabetic and hemoglobin A1c is 10 and's she knew preoperatively this is a possibility because of her poor compliance.   Current Outpatient Medications: aspirin 81 MG chewable tablet, Chew 81 mg by mouth at bedtime. , Disp: , Rfl:  cyclobenzaprine (FLEXERIL) 10 MG tablet, Take 10 mg by mouth at bedtime. , Disp: , Rfl:  diphenhydrAMINE (BENADRYL) 25 mg capsule, Take 50 mg by mouth at bedtime., Disp: , Rfl:  estradiol (VIVELLE-DOT) 0.1 MG/24HR patch, Place 1 patch (0.1 mg total) 2 (two) times a week onto the skin., Disp: 8 patch, Rfl: 12 FLUoxetine (PROZAC) 40 MG capsule, Take 40 mg by mouth at bedtime. , Disp: , Rfl:  hydrochlorothiazide (HYDRODIURIL) 25 MG tablet, Take 25 mg by mouth daily., Disp: , Rfl:  lansoprazole (PREVACID) 15 MG capsule, Take 15 mg by mouth daily. , Disp: , Rfl:  metFORMIN (GLUMETZA) 1000 MG (MOD) 24 hr tablet, Take 1 tablet (1,000 mg total) 2 (two) times daily by mouth. (Patient taking differently: Take 500 mg by mouth 2 (two) times daily. ), Disp: 60 tablet, Rfl: 11 Multiple Vitamins-Minerals (MULTIVITAL PO), Take 1 capsule by mouth daily., Disp: , Rfl:  OVER THE COUNTER MEDICATION, Take 1 capsule by mouth daily. Vital Core Nutrition Supplement, Disp: , Rfl:  silver sulfADIAZINE (SILVADENE) 1 % cream, Apply 1 application topically daily., Disp: 50 g, Rfl: 0 Simethicone (GAS-X PO), Take 1 tablet by mouth daily., Disp: , Rfl:  sulfamethoxazole-trimethoprim (BACTRIM DS,SEPTRA DS) 800-160 MG tablet, Take 1 tablet by mouth 2 (two) times daily for 7 days., Disp: 14 tablet, Rfl: 0 estradiol (ESTRACE) 2 MG tablet,  Take 1 tablet (2 mg total) by mouth daily. (Patient not taking: Reported on 01/12/2017), Disp: 30 tablet, Rfl: 11 ketorolac (TORADOL) 10 MG tablet, Take 1 tablet (10 mg total) every 8 (eight) hours as needed by mouth. (Patient not taking: Reported on 01/10/2017), Disp: 15 tablet, Rfl: 0 oxyCODONE-acetaminophen (PERCOCET) 7.5-325 MG tablet, Take 1-2 tablets every 4 (four) hours as needed by mouth for moderate pain. (Patient not taking: Reported on 01/07/2017), Disp: 40 tablet, Rfl: 0 oxyCODONE-acetaminophen (ROXICET) 5-325 MG tablet, Take 1-2 tablets by mouth every 4 (four) hours as needed for severe pain. (Patient not taking: Reported on 01/12/2017), Disp: 20 tablet, Rfl: 0 promethazine (PHENERGAN) 25 MG tablet, Take 1 tablet (25 mg total) every 6 (six) hours as needed by mouth for nausea. (Patient not taking: Reported on 01/10/2017), Disp: 30 tablet, Rfl: 1  No current facility-administered medications for this visit.     Blood pressure 120/72, pulse 90, height 5' 2.75" (1.594 m), weight 194 lb (88 kg), last menstrual period 11/26/2016.  Physical Exam: There is significantly less erythema around the left corner of the incision it is firmer but is in a much smaller coalesced area The packing placed 2 days ago was removed in the areas irrigated with half peroxide and half saline Patient continues to be on her Bactrim and putting on topical Silvadene cream as well Overall looks much better zarzuela healing  Diagnostic Tests:   Pathology:   Impression: Status post TAH/BSO with postoperative isolated wound infection  Plan: Follow-up in 2 days for ongoing wound care  Follow up:     Toula Moos  Chriss Driver, MD

## 2017-01-14 ENCOUNTER — Ambulatory Visit (INDEPENDENT_AMBULATORY_CARE_PROVIDER_SITE_OTHER): Payer: Self-pay | Admitting: Obstetrics & Gynecology

## 2017-01-14 ENCOUNTER — Encounter: Payer: Self-pay | Admitting: Obstetrics & Gynecology

## 2017-01-14 VITALS — BP 132/74 | HR 94 | Ht 62.75 in | Wt 194.5 lb

## 2017-01-14 DIAGNOSIS — Z9079 Acquired absence of other genital organ(s): Secondary | ICD-10-CM

## 2017-01-14 DIAGNOSIS — Z90710 Acquired absence of both cervix and uterus: Secondary | ICD-10-CM

## 2017-01-14 DIAGNOSIS — Z90722 Acquired absence of ovaries, bilateral: Secondary | ICD-10-CM

## 2017-01-14 DIAGNOSIS — T8149XA Infection following a procedure, other surgical site, initial encounter: Secondary | ICD-10-CM

## 2017-01-14 DIAGNOSIS — Z9889 Other specified postprocedural states: Secondary | ICD-10-CM

## 2017-01-14 NOTE — Progress Notes (Signed)
  HPI: Patient returns for routine postoperative follow-up having undergone TAH BSO on 12/22/2016.  The patient's immediate postoperative recovery has been unremarkable. Since hospital discharge the patient reports wound infection, left lateral.   Current Outpatient Medications: aspirin 81 MG chewable tablet, Chew 81 mg by mouth at bedtime. , Disp: , Rfl:  cyclobenzaprine (FLEXERIL) 10 MG tablet, Take 10 mg by mouth at bedtime. , Disp: , Rfl:  diphenhydrAMINE (BENADRYL) 25 mg capsule, Take 50 mg by mouth at bedtime., Disp: , Rfl:  estradiol (VIVELLE-DOT) 0.1 MG/24HR patch, Place 1 patch (0.1 mg total) 2 (two) times a week onto the skin., Disp: 8 patch, Rfl: 12 FLUoxetine (PROZAC) 40 MG capsule, Take 40 mg by mouth at bedtime. , Disp: , Rfl:  hydrochlorothiazide (HYDRODIURIL) 25 MG tablet, Take 25 mg by mouth daily., Disp: , Rfl:  lansoprazole (PREVACID) 15 MG capsule, Take 15 mg by mouth daily. , Disp: , Rfl:  metFORMIN (GLUMETZA) 1000 MG (MOD) 24 hr tablet, Take 1 tablet (1,000 mg total) 2 (two) times daily by mouth. (Patient taking differently: Take 500 mg by mouth 2 (two) times daily. ), Disp: 60 tablet, Rfl: 11 Multiple Vitamins-Minerals (MULTIVITAL PO), Take 1 capsule by mouth daily., Disp: , Rfl:  OVER THE COUNTER MEDICATION, Take 1 capsule by mouth daily. Vital Core Nutrition Supplement, Disp: , Rfl:  silver sulfADIAZINE (SILVADENE) 1 % cream, Apply 1 application topically daily., Disp: 50 g, Rfl: 0 Simethicone (GAS-X PO), Take 1 tablet by mouth daily., Disp: , Rfl:  sulfamethoxazole-trimethoprim (BACTRIM DS,SEPTRA DS) 800-160 MG tablet, Take 1 tablet by mouth 2 (two) times daily for 7 days., Disp: 14 tablet, Rfl: 0 estradiol (ESTRACE) 2 MG tablet, Take 1 tablet (2 mg total) by mouth daily. (Patient not taking: Reported on 01/12/2017), Disp: 30 tablet, Rfl: 11  No current facility-administered medications for this visit.     Blood pressure 132/74, pulse 94, height 5' 2.75" (1.594 m),  weight 194 lb 8 oz (88.2 kg), last menstrual period 11/26/2016.  Physical Exam: Pt with probable staph, assume, MRSA, infection of the left corner of her incision Catheter assisted irrigation is performed 1/2 H2O2/water with good results Gentian violet placed local care discussed  Diagnostic Tests: none  Pathology: benign  Impression: S/p TAH BSO iwht post operative wound infection in a poorly controlled diabetic patient  Plan: Return in about 3 days (around 01/17/2017) for @4  pm, Follow up, with Dr Elonda Husky.   Follow up: 4  days  Florian Buff, MD

## 2017-01-17 ENCOUNTER — Encounter: Payer: Self-pay | Admitting: Obstetrics & Gynecology

## 2017-01-17 ENCOUNTER — Ambulatory Visit (INDEPENDENT_AMBULATORY_CARE_PROVIDER_SITE_OTHER): Payer: Self-pay | Admitting: Obstetrics & Gynecology

## 2017-01-17 VITALS — BP 150/80 | HR 83 | Ht 62.75 in | Wt 195.0 lb

## 2017-01-17 DIAGNOSIS — Z90722 Acquired absence of ovaries, bilateral: Secondary | ICD-10-CM

## 2017-01-17 DIAGNOSIS — Z9079 Acquired absence of other genital organ(s): Secondary | ICD-10-CM

## 2017-01-17 DIAGNOSIS — Z9889 Other specified postprocedural states: Secondary | ICD-10-CM

## 2017-01-17 DIAGNOSIS — T8149XA Infection following a procedure, other surgical site, initial encounter: Secondary | ICD-10-CM

## 2017-01-17 DIAGNOSIS — Z90710 Acquired absence of both cervix and uterus: Secondary | ICD-10-CM

## 2017-01-17 NOTE — Progress Notes (Signed)
  HPI: Patient returns for routine postoperative follow-up having undergone TAH BSO on 12/22/2016.  The patient's immediate postoperative recovery has been unremarkable. Since hospital discharge the patient reports with post op wound infection in left corner, looks good healing well.   Current Outpatient Medications: aspirin 81 MG chewable tablet, Chew 81 mg by mouth at bedtime. , Disp: , Rfl:  cyclobenzaprine (FLEXERIL) 10 MG tablet, Take 10 mg by mouth at bedtime. , Disp: , Rfl:  diphenhydrAMINE (BENADRYL) 25 mg capsule, Take 50 mg by mouth at bedtime., Disp: , Rfl:  estradiol (VIVELLE-DOT) 0.1 MG/24HR patch, Place 1 patch (0.1 mg total) 2 (two) times a week onto the skin., Disp: 8 patch, Rfl: 12 FLUoxetine (PROZAC) 40 MG capsule, Take 40 mg by mouth at bedtime. , Disp: , Rfl:  hydrochlorothiazide (HYDRODIURIL) 25 MG tablet, Take 25 mg by mouth daily., Disp: , Rfl:  lansoprazole (PREVACID) 15 MG capsule, Take 15 mg by mouth daily. , Disp: , Rfl:  metFORMIN (GLUMETZA) 1000 MG (MOD) 24 hr tablet, Take 1 tablet (1,000 mg total) 2 (two) times daily by mouth. (Patient taking differently: Take 500 mg by mouth 2 (two) times daily. ), Disp: 60 tablet, Rfl: 11 OVER THE COUNTER MEDICATION, Take 1 capsule by mouth daily. Vital Core Nutrition Supplement, Disp: , Rfl:  silver sulfADIAZINE (SILVADENE) 1 % cream, Apply 1 application topically daily., Disp: 50 g, Rfl: 0 Simethicone (GAS-X PO), Take 1 tablet by mouth daily., Disp: , Rfl:   No current facility-administered medications for this visit.     Blood pressure (!) 150/80, pulse 83, height 5' 2.75" (1.594 m), weight 195 lb (88.5 kg), last menstrual period 11/26/2016.  Physical Exam: Area of wound infection is essentially healed up no evidence of infection closing rapidly  Diagnostic Tests:   Pathology: benign  Impression: S/p TAH BSO  Plan: Continue local care  Follow up: 4  days  Florian Buff, MD

## 2017-01-21 ENCOUNTER — Ambulatory Visit (INDEPENDENT_AMBULATORY_CARE_PROVIDER_SITE_OTHER): Payer: Self-pay | Admitting: Obstetrics & Gynecology

## 2017-01-21 ENCOUNTER — Encounter: Payer: Self-pay | Admitting: Obstetrics & Gynecology

## 2017-01-21 VITALS — BP 150/70 | HR 92 | Ht 62.75 in | Wt 193.5 lb

## 2017-01-21 DIAGNOSIS — T8149XA Infection following a procedure, other surgical site, initial encounter: Secondary | ICD-10-CM

## 2017-01-21 DIAGNOSIS — Z9889 Other specified postprocedural states: Secondary | ICD-10-CM

## 2017-01-21 NOTE — Progress Notes (Signed)
  HPI: Patient returns for routine postoperative follow-up having undergone TAH BSO on 12/22/2016.  The patient's immediate postoperative recovery has been unremarkable. Since hospital discharge the patient reports with post op wound infection in left corner, looks good healing well.   Current Outpatient Medications: aspirin 81 MG chewable tablet, Chew 81 mg by mouth at bedtime. , Disp: , Rfl:  cyclobenzaprine (FLEXERIL) 10 MG tablet, Take 10 mg by mouth at bedtime. , Disp: , Rfl:  diphenhydrAMINE (BENADRYL) 25 mg capsule, Take 50 mg by mouth at bedtime., Disp: , Rfl:  estradiol (VIVELLE-DOT) 0.1 MG/24HR patch, Place 1 patch (0.1 mg total) 2 (two) times a week onto the skin., Disp: 8 patch, Rfl: 12 FLUoxetine (PROZAC) 40 MG capsule, Take 40 mg by mouth at bedtime. , Disp: , Rfl:  hydrochlorothiazide (HYDRODIURIL) 25 MG tablet, Take 25 mg by mouth daily., Disp: , Rfl:  lansoprazole (PREVACID) 15 MG capsule, Take 15 mg by mouth daily. , Disp: , Rfl:  metFORMIN (GLUMETZA) 1000 MG (MOD) 24 hr tablet, Take 1 tablet (1,000 mg total) 2 (two) times daily by mouth. (Patient taking differently: Take 500 mg by mouth 2 (two) times daily. ), Disp: 60 tablet, Rfl: 11 OVER THE COUNTER MEDICATION, Take 1 capsule by mouth daily. Vital Core Nutrition Supplement, Disp: , Rfl:  silver sulfADIAZINE (SILVADENE) 1 % cream, Apply 1 application topically daily., Disp: 50 g, Rfl: 0 Simethicone (GAS-X PO), Take 1 tablet by mouth daily., Disp: , Rfl:   No current facility-administered medications for this visit.     Blood pressure (!) 150/80, pulse 83, height 5' 2.75" (1.594 m), weight 195 lb (88.5 kg), last menstrual period 11/26/2016.  Physical Exam: Area of wound infection is essentially healed up no evidence of infection closing rapidly  Diagnostic Tests:   Pathology: benign  Impression: S/p TAH BSO  Plan: Continue local care  Follow up: Return in about 4 weeks (around 02/18/2017) for Follow up, with  Dr Elonda Husky.   Florian Buff, MD

## 2017-01-25 ENCOUNTER — Encounter: Payer: Medicaid Other | Admitting: Obstetrics & Gynecology

## 2017-02-17 ENCOUNTER — Ambulatory Visit (INDEPENDENT_AMBULATORY_CARE_PROVIDER_SITE_OTHER): Payer: Self-pay | Admitting: Obstetrics & Gynecology

## 2017-02-17 ENCOUNTER — Encounter: Payer: Self-pay | Admitting: Obstetrics & Gynecology

## 2017-02-17 VITALS — BP 130/80 | HR 98 | Wt 194.0 lb

## 2017-02-17 DIAGNOSIS — Z90710 Acquired absence of both cervix and uterus: Secondary | ICD-10-CM

## 2017-02-17 DIAGNOSIS — Z90722 Acquired absence of ovaries, bilateral: Secondary | ICD-10-CM

## 2017-02-17 DIAGNOSIS — Z9889 Other specified postprocedural states: Secondary | ICD-10-CM

## 2017-02-17 DIAGNOSIS — Z9079 Acquired absence of other genital organ(s): Secondary | ICD-10-CM

## 2017-02-17 MED ORDER — ESTRADIOL 2 MG PO TABS: 2.0000 mg | ORAL_TABLET | Freq: Every day | ORAL | 11 refills | Status: DC

## 2017-02-17 NOTE — Progress Notes (Signed)
  HPI: Patient returns for routine postoperative follow-up having undergone total abdominal hysterectomy with bilateral salpingo-oophorectomy  on 12/22/2016.  The patient's immediate postoperative recovery has been unremarkable. Since hospital discharge the patient reports she is now doing well she did have a problem with a postoperative wound infection which we manage conservatively here in the office It has completely resolved at this point Against my recommendations the patient has been having intercourse without any problems.   Current Outpatient Medications: aspirin 81 MG chewable tablet, Chew 81 mg by mouth at bedtime. , Disp: , Rfl:  cyclobenzaprine (FLEXERIL) 10 MG tablet, Take 10 mg by mouth at bedtime. , Disp: , Rfl:  diphenhydrAMINE (BENADRYL) 25 mg capsule, Take 50 mg by mouth at bedtime., Disp: , Rfl:  FLUoxetine (PROZAC) 40 MG capsule, Take 40 mg by mouth at bedtime. , Disp: , Rfl:  hydrochlorothiazide (HYDRODIURIL) 25 MG tablet, Take 25 mg by mouth daily., Disp: , Rfl:  ibuprofen (ADVIL,MOTRIN) 200 MG tablet, Take 600 mg by mouth as needed., Disp: , Rfl:  lansoprazole (PREVACID) 15 MG capsule, Take 20 mg by mouth daily. , Disp: , Rfl:  metFORMIN (GLUMETZA) 1000 MG (MOD) 24 hr tablet, Take 1 tablet (1,000 mg total) 2 (two) times daily by mouth. (Patient taking differently: Take 500 mg by mouth 2 (two) times daily. ), Disp: 60 tablet, Rfl: 11 Simethicone (GAS-X PO), Take 1 tablet by mouth as needed. , Disp: , Rfl:  estradiol (VIVELLE-DOT) 0.1 MG/24HR patch, Place 1 patch (0.1 mg total) 2 (two) times a week onto the skin. (Patient not taking: Reported on 02/17/2017), Disp: 8 patch, Rfl: 12 OVER THE COUNTER MEDICATION, Take 1 capsule by mouth daily. Vital Core Nutrition Supplement, Disp: , Rfl:  silver sulfADIAZINE (SILVADENE) 1 % cream, Apply 1 application topically daily. (Patient not taking: Reported on 02/17/2017), Disp: 50 g, Rfl: 0  No current facility-administered medications for  this visit.     Blood pressure 130/80, pulse 98, weight 194 lb (88 kg), last menstrual period 11/26/2016.  Physical Exam: Abdomen is benign incision is clean dry and intact no evidence of infection The vagina is normal and the vaginal cuff is well-healed and intact The vaginal cuff is nontender to palpation There are no midline or adnexal masses palpable  Diagnostic Tests:   Pathology: Benign  Impression: Status post total abdominal hysterectomy with bilateral salpingo-oophorectomy  Plan: Estradiol 2 mg tablets   Follow up: 1  years  Florian Buff, MD

## 2017-07-26 ENCOUNTER — Other Ambulatory Visit: Payer: Self-pay

## 2017-07-26 ENCOUNTER — Encounter (HOSPITAL_COMMUNITY): Payer: Self-pay

## 2017-07-26 ENCOUNTER — Emergency Department (HOSPITAL_COMMUNITY)
Admission: EM | Admit: 2017-07-26 | Discharge: 2017-07-27 | Disposition: A | Discharge status: Home / Self Care | Payer: Worker's Compensation | Attending: Emergency Medicine | Admitting: Emergency Medicine

## 2017-07-26 DIAGNOSIS — Z7984 Long term (current) use of oral hypoglycemic drugs: Secondary | ICD-10-CM | POA: Diagnosis not present

## 2017-07-26 DIAGNOSIS — Y929 Unspecified place or not applicable: Secondary | ICD-10-CM | POA: Diagnosis not present

## 2017-07-26 DIAGNOSIS — S61211A Laceration without foreign body of left index finger without damage to nail, initial encounter: Secondary | ICD-10-CM

## 2017-07-26 DIAGNOSIS — Z79899 Other long term (current) drug therapy: Secondary | ICD-10-CM | POA: Insufficient documentation

## 2017-07-26 DIAGNOSIS — E119 Type 2 diabetes mellitus without complications: Secondary | ICD-10-CM | POA: Diagnosis not present

## 2017-07-26 DIAGNOSIS — X58XXXA Exposure to other specified factors, initial encounter: Secondary | ICD-10-CM | POA: Diagnosis not present

## 2017-07-26 DIAGNOSIS — Z7982 Long term (current) use of aspirin: Secondary | ICD-10-CM | POA: Diagnosis not present

## 2017-07-26 DIAGNOSIS — Y939 Activity, unspecified: Secondary | ICD-10-CM | POA: Diagnosis not present

## 2017-07-26 DIAGNOSIS — S6992XA Unspecified injury of left wrist, hand and finger(s), initial encounter: Secondary | ICD-10-CM | POA: Diagnosis present

## 2017-07-26 DIAGNOSIS — I10 Essential (primary) hypertension: Secondary | ICD-10-CM | POA: Insufficient documentation

## 2017-07-26 DIAGNOSIS — Y990 Civilian activity done for income or pay: Secondary | ICD-10-CM | POA: Diagnosis not present

## 2017-07-26 NOTE — ED Triage Notes (Signed)
I had my left index finger nail pulled out when it was caught in a door tonight at work tonight.  I have on acrylic nails per pt.

## 2017-07-27 NOTE — Discharge Instructions (Addendum)
Keep the finger clean and dry.  Wear the splint as needed for protection to your finger.  Allow the steri-strips to come off naturally.  Elevate your hand tonight.  Ibuprofen if needed for pain

## 2017-07-27 NOTE — ED Notes (Signed)
Pt ambulatory to waiting room. Pt verbalized understanding of discharge instructions.   

## 2017-07-27 NOTE — ED Provider Notes (Signed)
[facility] EMERGENCY DEPARTMENT Provider Note   CSN: [id number] Arrival date & time: 07/26/17  2140     History   Chief Complaint Chief Complaint  Patient presents with  . Finger Injury    HPI Yvonne Mckee is a 52 y.o. female.  HPI   Yvonne Mckee is a 52 y.o. female who presents to the Emergency Department complaining of pain and bleeding of her left index finger.  This is a work related injury.  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  Natural nail is intact.  She initially cleaned the wound with water, but continued to bleed.  She complains of throbbing pain to her distal finger.  Denies numbness, difficulty with movement o the finger, use of blood thinners and pain of the proximal finger.  Td is up to date.     Past Medical History:  Diagnosis Date  . Anemia   . Anxiety   . Calculus of gallbladder with acute cholecystitis, without mention of obstruction 03/30/2012  . Depression   . Depression 03/30/2012  . Diabetes mellitus without complication ([place])   . GERD (gastroesophageal reflux disease)   . Hypertension   . Menometrorrhagia   . Sciatica   . Sciatica 03/30/2012  . Severe obesity (BMI >= 40) ([place]) 03/30/2012    Patient Active Problem List   Diagnosis Date Noted  . Abdominal wall hematoma 01/08/2017  . S/P TAH-BSO 12/22/2016  . Calculus of gallbladder with acute cholecystitis, without mention of obstruction 03/30/2012  . Depression 03/30/2012  . Sciatica 03/30/2012  . Severe obesity (BMI >= 40) ([place]) 03/30/2012  . BACK PAIN, LUMBAR 07/24/2008  . OTITIS MEDIA, ACUTE 07/08/2008  . CELLULITIS, FINGER 07/08/2008  . OBESITY 06/17/2008  . UPPER RESPIRATORY INFECTION, VIRAL 05/14/2008  . HYPERTENSION 06/07/2007  . FLUID RETENTION 06/02/2006  . ELEVATED BLOOD PRESSURE WITHOUT DIAGNOSIS OF HYPERTENSION 04/25/2006   . ANXIETY 04/22/2006  . DEPRESSION 04/22/2006  . MIGRAINE HEADACHE 04/22/2006  . GERD 04/22/2006    Past Surgical History:  Procedure Laterality Date  . ABDOMINAL HYSTERECTOMY N/A 12/22/2016   Procedure: HYSTERECTOMY ABDOMINAL;  Surgeon: Florian Buff, MD;  Location: [facility]S;  Service: Gynecology;  Laterality: N/A;  . CESAREAN SECTION     x 2  . CHOLECYSTECTOMY N/A 03/29/2012   Procedure: LAPAROSCOPIC CHOLECYSTECTOMY WITH INTRAOPERATIVE CHOLANGIOGRAM;  Surgeon: Joyice Faster. Cornett, MD;  Location: [facility];  Service: General;  Laterality: N/A;  . SALPINGOOPHORECTOMY Bilateral 12/22/2016   Procedure: BILATERAL SALPINGO OOPHORECTOMY;  Surgeon: Florian Buff, MD;  Location: [facility];  Service: Gynecology;  Laterality: Bilateral;  . TONSILLECTOMY    . TUBAL LIGATION       OB History    Gravida  2   Para  2   Term  2   Preterm      AB      Living  2     SAB      TAB      Ectopic      Multiple      Live Births  2            Home Medications    Prior to Admission medications   Medication Sig Start Date End Date Taking? Authorizing Provider  aspirin 81 MG chewable tablet Chew 81 mg by mouth at bedtime.  Yes [provider]  cyclobenzaprine (FLEXERIL) 10 MG tablet Take 10 mg by mouth at bedtime.    Yes [provider]  diphenhydrAMINE (BENADRYL) 25 mg capsule Take 50 mg by mouth at bedtime.   Yes [provider]  estradiol (ESTRACE) 2 MG tablet Take 1 tablet (2 mg total) by mouth daily. 02/17/17  Yes Florian Buff, MD  FLUoxetine (PROZAC) 40 MG capsule Take 40 mg by mouth at bedtime.    Yes [provider]  hydrochlorothiazide (HYDRODIURIL) 25 MG tablet Take 25 mg by mouth daily.   Yes [provider]  lansoprazole (PREVACID) 15 MG capsule Take 20 mg by mouth daily.    Yes [provider]  metFORMIN (GLUCOPHAGE-XR) 500 MG 24 hr tablet Take 500 mg by mouth 2 (two) times daily. 07/21/17  Yes [provider]     Family History Family History  Problem Relation Age of Onset  . Asthma Daughter   . Diabetes Maternal Grandmother   . Hypertension Maternal Grandmother   . Congestive Heart Failure Maternal Grandmother   . Emphysema Maternal Grandmother   . Cancer Maternal Grandmother        lung  . Other Father        heart problems  . Hypertension Mother   . Other Mother        hardening of heart  . Hypertension Brother   . Diabetes Brother   . Other Son        born with lypomengeocele    Social History Social History   Tobacco Use  . Smoking status: Never Smoker  . Smokeless tobacco: Never Used  Substance Use Topics  . Alcohol use: Yes    Comment: very seldom  . Drug use: No     Allergies   Bupropion hcl; Squid oil; and St johns wort   Review of Systems Review of Systems  Constitutional: Negative for chills and fever.  Musculoskeletal: Negative for arthralgias and joint swelling.  Skin: Positive for wound. Negative for color change.       Skin tear of the left index finger.    Neurological: Negative for weakness and numbness.  All other systems reviewed and are negative.    Physical Exam Updated Vital Signs BP (!) 171/80 (BP Location: Right Arm)   Pulse 81   Temp 98.1 F (36.7 C) (Oral)   Ht 5' 2.25" (1.581 m)   Wt 86.2 kg (190 lb)   LMP 11/26/2016   SpO2 99%   BMI 34.47 kg/m   Physical Exam  Constitutional: She appears well-developed and well-nourished. No distress.  HENT:  Head: Atraumatic.  Cardiovascular: Normal rate and regular rhythm.  Pulmonary/Chest: Effort normal and breath sounds normal.  Musculoskeletal: Normal range of motion.       Left hand: She exhibits normal capillary refill and no swelling. Normal sensation noted. Normal strength noted.       Hands: Pt has full ROM of the left index finger.  No edema or bony tenderness of the DIP or PIP.  Nail is intact.  No subungual hematoma.  Small superficial skin tear at the lateral paronychium of  the left index finger.    Neurological: She is alert. No sensory deficit.  Skin: Skin is warm. Capillary refill takes less than 2 seconds.  Nursing note and vitals reviewed.    ED Treatments / Results  Labs (all labs ordered are listed, but only abnormal results are displayed) Labs Reviewed - No data to display  EKG None  Radiology No results found.  Procedures Procedures (including critical care time)  Medications Ordered in ED Medications - No data to display   Initial Impression / Assessment and Plan / ED Course  I have reviewed the triage vital signs and the nursing notes.  Pertinent labs & imaging results that were available during my care of the patient were reviewed by me and considered in my medical decision making (see chart for details).     Pt with work related injury while wearing acrylic nails.  Natural nail intact w/o subungual hematoma or nail bed injury.  NV intact.  Skin tear cleaned with saline and steri-stripped x 2.  Finger splint applied for protection.    Final Clinical Impressions(s) / ED Diagnoses   Final diagnoses:  Laceration of left index finger without foreign body without damage to nail, initial encounter    ED Discharge Orders    None       Kem Parkinson, PA-C 07/27/17 0106    Ripley Fraise, MD 07/27/17 [phone]

## 2017-12-15 ENCOUNTER — Ambulatory Visit: Payer: Self-pay | Admitting: Nurse Practitioner

## 2017-12-15 VITALS — BP 130/68 | HR 88 | Temp 98.3°F | Resp 16 | Ht 62.5 in | Wt 201.8 lb

## 2017-12-15 DIAGNOSIS — M62838 Other muscle spasm: Secondary | ICD-10-CM

## 2017-12-15 DIAGNOSIS — M545 Low back pain: Principal | ICD-10-CM

## 2017-12-15 DIAGNOSIS — G8929 Other chronic pain: Secondary | ICD-10-CM

## 2017-12-15 MED ORDER — TRIAMCINOLONE ACETONIDE 40 MG/ML IJ SUSP: 40.0000 mg | Freq: Once | INTRAMUSCULAR | Status: AC

## 2017-12-15 NOTE — Patient Instructions (Addendum)
To continue heat and start doing stretching Discussed use of NSAIDS and to use sparingly Continue flexeril as directed Kenalog 40mg  IM x 1 dose given in office today and SE discussed; which includes rise in glucose RTC if no improvement

## 2017-12-15 NOTE — Progress Notes (Addendum)
   Subjective:    Patient ID: Yvonne Mckee, female    DOB: [birth year], 52 y.o.   MRN: [id number]  HPI Yvonne Mckee comes to the employee wellness clinic today with c/o low back pain and spasms that have been persistent but worsened in the last week. 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 She denies any injury occurring on the job but feels it is worsened by her type of work. She reports this as "mid" back however on exam it's her lower back. She reports pain 0/10 and has been using heat, taking 600mg   Ibuprofen at bedtime and using flexeril her PCP gave her with some relief but returns.  She's concerned about weight today and admits poor eating habits with a fasting glucose of 150.     Review of Systems  Musculoskeletal: Positive for back pain. Negative for gait problem, joint swelling and neck stiffness.  Skin: Negative for color change.  Neurological: Negative for weakness.       Denies radiculopathy  Psychiatric/Behavioral: Negative for agitation and behavioral problems.       Denies SI/HI and PHQ2: negative       Objective:   Physical Exam  Constitutional: She is oriented to person, place, and time. She appears well-developed and well-nourished.  HENT:  Head: Normocephalic.  Neck: Normal range of motion.  Cardiovascular: Normal rate, regular rhythm and normal heart sounds.  Pulmonary/Chest: Effort normal and breath sounds normal. No respiratory distress.  Abdominal: Soft. Bowel sounds are normal.  Musculoskeletal: Normal range of motion. She exhibits tenderness.  Tenderness to L/S region on palpation bilaterally  Neurological: She is alert and oriented to person, place, and time.  Skin: Skin is warm and dry.  Psychiatric: She has a normal mood and affect.  Vitals reviewed.   Kenalog 40mg  IM x 1 given to left gluteal area and tolerated inj Lot: [id number] EP: Sept. 2020       Assessment & Plan:

## 2018-01-11 ENCOUNTER — Other Ambulatory Visit: Payer: Self-pay | Admitting: Obstetrics & Gynecology

## 2018-02-23 ENCOUNTER — Ambulatory Visit: Payer: Self-pay | Admitting: Nurse Practitioner

## 2018-02-23 VITALS — BP 132/80 | HR 87 | Temp 98.2°F | Resp 16 | Ht 62.5 in | Wt 199.8 lb

## 2018-02-23 DIAGNOSIS — H6503 Acute serous otitis media, bilateral: Secondary | ICD-10-CM

## 2018-02-23 DIAGNOSIS — J0110 Acute frontal sinusitis, unspecified: Secondary | ICD-10-CM

## 2018-02-23 DIAGNOSIS — E119 Type 2 diabetes mellitus without complications: Secondary | ICD-10-CM | POA: Insufficient documentation

## 2018-02-23 DIAGNOSIS — E1159 Type 2 diabetes mellitus with other circulatory complications: Secondary | ICD-10-CM

## 2018-02-23 MED ORDER — FLUCONAZOLE 150 MG PO TABS: ORAL_TABLET | ORAL | 0 refills | Status: DC

## 2018-02-23 MED ORDER — AMOXICILLIN-POT CLAVULANATE 875-125 MG PO TABS: 1.0000 | ORAL_TABLET | Freq: Two times a day (BID) | ORAL | 0 refills | Status: DC

## 2018-02-23 NOTE — Patient Instructions (Addendum)
Discussed dayquil and to use caution as it contains Phenylnephrine and verbalized understanding Fluids, rest and handwashing discussed and encouraged Take meds as directed Continue healthy eating habits  Take probiotics or eat a yogurt with bacilla If no improvement return to the clinic as needed If you break out again on your hands please return to the clinic Meds ordered this encounter  Medications  . fluconazole (DIFLUCAN) 150 MG tablet    Sig: 1 tab PO x 1 dose; may repeat dose in 72 hours if ineffective    Dispense:  2 tablet    Refill:  0  . amoxicillin-clavulanate (AUGMENTIN) 875-125 MG tablet    Sig: Take 1 tablet by mouth 2 (two) times daily.    Dispense:  14 tablet    Refill:  0

## 2018-02-23 NOTE — Progress Notes (Signed)
Subjective:    Patient ID: Yvonne Mckee, female    DOB: 08/10/1965, 53 y.o.   MRN: [id number]  HPI Yaniris comes to the [facility] clinic today with reports of nasal congestion for 5 days that isn't improving. She reports she is now developing a cough and concerned that it may have gone into her chest. It initially started off as throat irritation with some "drainage" and ear pressure with pain to the right ear. She admits in the morning she has yellow/green nasal discharge and her nose continuously runs throughout the day. She has taken phenylephrine "some red generic tabs" and theraflu at night. Now she take theraflu at night and dayquil during the day. She denies fever, n/v/d, chest pain or wheezing.  She has a concern that she is allergic to the gloves she uses at work and was using nitrile gloves and doing well but her immediate supervisor has requested she get a note noting she is allergic to the vinyl gloves to continue the nitrile gloves. Today she admits that is resolved because she hasn't had to use the vinyl gloves and will have to go back to using until she has a note.  Since our last OV she admits she has cut back on drinks and has a more carb modified diet and her glucose has gone done; noted today she has lost 2 lbs since last OV and we discussed this.     Review of Systems  HENT: Positive for congestion, ear pain, postnasal drip, rhinorrhea, sinus pressure, sinus pain and sore throat.   Respiratory: Positive for cough and wheezing.   Cardiovascular: Negative for chest pain.  Gastrointestinal: Negative for abdominal pain, diarrhea, nausea and vomiting.  Skin: Negative for color change, rash and wound.       Objective:   Physical Exam Vitals signs reviewed.  Constitutional:      General: She is not in acute distress.    Appearance: Normal appearance. She is well-developed. She is obese. She is not ill-appearing.  HENT:     Head: Normocephalic and atraumatic.     Comments:  No maxillary sinus tenderness but has frontal sinus tenderness    Right Ear: Ear canal normal. There is no impacted cerumen.     Left Ear: Ear canal normal. There is no impacted cerumen.     Ears:     Comments: Bilateral ears with cloudy fluid behind intact TM, right TM mildly injected    Nose: Nose normal.     Mouth/Throat:     Mouth: Mucous membranes are dry.     Comments: Mildly injected pharynx Neck:     Musculoskeletal: Normal range of motion and neck supple.     Comments: Bilateral upper anterior lymphadenopathy with tenderness on palpation Cardiovascular:     Rate and Rhythm: Normal rate and regular rhythm.     Pulses: Normal pulses.     Heart sounds: Normal heart sounds.  Pulmonary:     Effort: Pulmonary effort is normal. No respiratory distress.     Breath sounds: Normal breath sounds.  Abdominal:     General: Bowel sounds are normal.     Palpations: Abdomen is soft.     Tenderness: There is no abdominal tenderness.  Musculoskeletal: Normal range of motion.  Lymphadenopathy:     Cervical: Cervical adenopathy present.  Skin:    General: Skin is warm and dry.  Neurological:     Mental Status: She is alert and oriented to person, place, and time.  Assessment & Plan:

## 2018-03-09 ENCOUNTER — Other Ambulatory Visit: Payer: Self-pay | Admitting: Family Medicine

## 2018-03-09 DIAGNOSIS — Z1231 Encounter for screening mammogram for malignant neoplasm of breast: Secondary | ICD-10-CM

## 2018-04-14 ENCOUNTER — Ambulatory Visit: Payer: Self-pay

## 2019-01-16 ENCOUNTER — Other Ambulatory Visit: Payer: Self-pay

## 2019-01-16 ENCOUNTER — Ambulatory Visit
Admission: EM | Admit: 2019-01-16 | Discharge: 2019-01-16 | Disposition: A | Discharge status: Home / Self Care | Payer: PRIVATE HEALTH INSURANCE | Attending: Emergency Medicine | Admitting: Emergency Medicine

## 2019-01-16 DIAGNOSIS — H9201 Otalgia, right ear: Secondary | ICD-10-CM | POA: Diagnosis not present

## 2019-01-16 DIAGNOSIS — R05 Cough: Secondary | ICD-10-CM | POA: Diagnosis not present

## 2019-01-16 DIAGNOSIS — U071 COVID-19: Secondary | ICD-10-CM | POA: Diagnosis not present

## 2019-01-16 DIAGNOSIS — Z20822 Contact with and (suspected) exposure to covid-19: Secondary | ICD-10-CM

## 2019-01-16 DIAGNOSIS — Z20828 Contact with and (suspected) exposure to other viral communicable diseases: Secondary | ICD-10-CM

## 2019-01-16 LAB — POC SARS CORONAVIRUS 2 AG -  ED: SARS Coronavirus 2 Ag: POSITIVE — AB

## 2019-01-16 MED ORDER — CETIRIZINE HCL 10 MG PO CHEW: 10.0000 mg | CHEWABLE_TABLET | Freq: Every day | ORAL | 0 refills | Status: DC

## 2019-01-16 MED ORDER — FLUTICASONE PROPIONATE 50 MCG/ACT NA SUSP
2.0000 | Freq: Every day | NASAL | 0 refills | Status: DC
Start: 2019-01-16 — End: 2020-01-18

## 2019-01-16 MED ORDER — BENZONATATE 100 MG PO CAPS: 100.0000 mg | ORAL_CAPSULE | Freq: Three times a day (TID) | ORAL | 0 refills | Status: DC

## 2019-01-16 NOTE — ED Triage Notes (Signed)
Pt had positive exposure  last week and developed symptoms Sunday, pt has had fever and cough since Sunday

## 2019-01-16 NOTE — Discharge Instructions (Addendum)
COVID test was positive You should remain isolated in your home for 10 days from symptom onset AND greater than 72 hours after symptoms resolution (absence of fever without the use of fever-reducing medication and improvement in respiratory symptoms), whichever is longer Get plenty of rest and push fluids Use zyrtec for nasal congestion, runny nose, and/or sore throat Use flonase for nasal congestion and runny nose Use medications daily for symptom relief Use OTC medications like ibuprofen or tylenol as needed fever or pain Follow up with PCP in 1-2 days via phone or e-visit for recheck and to ensure symptoms are improving Call or go to the ED if you have any new or worsening symptoms such as fever, worsening cough, shortness of breath, chest tightness, chest pain, turning blue, changes in mental status, etc...   

## 2019-01-16 NOTE — ED Provider Notes (Signed)
[address]   [id number] 01/16/19 Arrival Time: 2841   CC: COVID symptom; COVID test  SUBJECTIVE: History from: patient.  Yvonne Mckee is a 53 y.o. female who presents with chills, fatigue, fever with tmax 100.7, productive cough with green/ yellow sputum, RT ear ache, and swollen glands x 3 days.  Admits to positive exposure to COVID 4 days ago to boyfriend.  Had a rapid COVID test Saturday,which was negative.  Was asymptomatic at that time.  Had been using nyquil with minimal relief.  Denies aggravating factors. Denies sinus pain, rhinorrhea, sore throat, SOB, wheezing, chest pain, nausea, vomiting, changes in bowel or bladder habits.    ROS: As per HPI.  All other pertinent ROS negative.     Past Medical History:  Diagnosis Date  . Anemia   . Anxiety   . Calculus of gallbladder with acute cholecystitis, without mention of obstruction 03/30/2012  . Depression   . Depression 03/30/2012  . Diabetes mellitus without complication ([place])   . GERD (gastroesophageal reflux disease)   . Hypertension   . Menometrorrhagia   . Sciatica   . Sciatica 03/30/2012  . Severe obesity (BMI >= 40) ([place]) 03/30/2012   Past Surgical History:  Procedure Laterality Date  . ABDOMINAL HYSTERECTOMY N/A 12/22/2016   Procedure: HYSTERECTOMY ABDOMINAL;  Surgeon: Florian Buff, MD;  Location: [facility];  Service: Gynecology;  Laterality: N/A;  . CESAREAN SECTION     x 2  . CHOLECYSTECTOMY N/A 03/29/2012   Procedure: LAPAROSCOPIC CHOLECYSTECTOMY WITH INTRAOPERATIVE CHOLANGIOGRAM;  Surgeon: Joyice Faster. Cornett, MD;  Location: [facility];  Service: General;  Laterality: N/A;  . SALPINGOOPHORECTOMY Bilateral 12/22/2016   Procedure: BILATERAL SALPINGO OOPHORECTOMY;  Surgeon: Florian Buff, MD;  Location: [facility];  Service: Gynecology;  Laterality: Bilateral;  . TONSILLECTOMY    . TUBAL LIGATION     Allergies  Allergen Reactions  . Bupropion Hcl Other (See Comments)    Unknown reaction: Pt states that she  remembers welts post taking medication  . Squid Oil Swelling  . [place] Wort Rash   Current Facility-Administered Medications on File Prior to Encounter  Medication Dose Route Frequency Provider Last Rate Last Dose  . triamcinolone acetonide (KENALOG-40) injection 40 mg  40 mg Intramuscular Once Maury Dus, NP       Current Outpatient Medications on File Prior to Encounter  Medication Sig Dispense Refill  . aspirin 81 MG chewable tablet Chew 81 mg by mouth at bedtime.     Marland Kitchen aspirin 81 MG chewable tablet Chew by mouth.    . cyclobenzaprine (FLEXERIL) 10 MG tablet Take 10 mg by mouth at bedtime.     . diphenhydrAMINE (BENADRYL) 25 mg capsule Take 50 mg by mouth at bedtime.    . diphenhydrAMINE (BENADRYL) 25 mg capsule Take by mouth.    . estradiol (ESTRACE) 2 MG tablet Take 1 tablet (2 mg total) by mouth daily. 30 tablet 11  . estradiol (ESTRACE) 2 MG tablet Take by mouth.    . estradiol (ESTRACE) 2 MG tablet TAKE 1 TABLET BY MOUTH ONCE DAILY 30 tablet 11  . FLUoxetine (PROZAC) 40 MG capsule Take 40 mg by mouth at bedtime.     Marland Kitchen FLUoxetine (PROZAC) 40 MG capsule Take by mouth.    . hydrochlorothiazide (HYDRODIURIL) 25 MG tablet Take 25 mg by mouth daily.    . hydrochlorothiazide (HYDRODIURIL) 25 MG tablet Take by mouth.    . lansoprazole (PREVACID) 15 MG capsule Take 20 mg by  mouth daily.     . metFORMIN (GLUCOPHAGE-XR) 500 MG 24 hr tablet Take 500 mg by mouth 2 (two) times daily.  6  . metFORMIN (GLUCOPHAGE-XR) 500 MG 24 hr tablet Take by mouth.    . pantoprazole (PROTONIX) 40 MG tablet Take by mouth.     Social History   Socioeconomic History  . Marital status: Divorced    Spouse name: Not on file  . Number of children: Not on file  . Years of education: Not on file  . Highest education level: Not on file  Occupational History  . Not on file  Social Needs  . Financial resource strain: Not on file  . Food insecurity    Worry: Not on file    Inability: Not on file  .  Transportation needs    Medical: Not on file    Non-medical: Not on file  Tobacco Use  . Smoking status: Never Smoker  . Smokeless tobacco: Never Used  Substance and Sexual Activity  . Alcohol use: Yes    Comment: very seldom  . Drug use: No  . Sexual activity: Not Currently    Birth control/protection: Surgical    Comment: hyst  Lifestyle  . Physical activity    Days per week: Not on file    Minutes per session: Not on file  . Stress: Not on file  Relationships  . Social Herbalist on phone: Not on file    Gets together: Not on file    Attends religious service: Not on file    Active member of club or organization: Not on file    Attends meetings of clubs or organizations: Not on file    Relationship status: Not on file  . Intimate partner violence    Fear of current or ex partner: Not on file    Emotionally abused: Not on file    Physically abused: Not on file    Forced sexual activity: Not on file  Other Topics Concern  . Not on file  Social History Narrative  . Not on file   Family History  Problem Relation Age of Onset  . Asthma Daughter   . Diabetes Maternal Grandmother   . Hypertension Maternal Grandmother   . Congestive Heart Failure Maternal Grandmother   . Emphysema Maternal Grandmother   . Cancer Maternal Grandmother        lung  . Other Father        heart problems  . Hypertension Mother   . Other Mother        hardening of heart  . Hypertension Brother   . Diabetes Brother   . Other Son        born with lypomengeocele    OBJECTIVE:  Vitals:   01/16/19 1823  BP: (!) 156/91  Pulse: (!) 106  Resp: 18  Temp: 99.6 F (37.6 C)  SpO2: 95%     General appearance: alert; appears fatigued, but nontoxic; speaking in full sentences and tolerating own secretions HEENT: NCAT; Ears: EACs clear, TMs pearly gray; Eyes: PERRL.  EOM grossly intact. Nose: nares patent without rhinorrhea, Throat: oropharynx clear, tonsils non erythematous or  enlarged, uvula midline  Neck: supple without LAD Lungs: unlabored respirations, symmetrical air entry; cough: mild; no respiratory distress; CTAB Heart: regular rate and rhythm.  Skin: warm and dry Psychological: alert and cooperative; normal mood and affect  LABS:  Results for orders placed or performed during the hospital encounter of 01/16/19 (from the  past 24 hour(s))  POC SARS Coronavirus 2 Ag-ED - Nasal Swab (BD Veritor Kit)     Status: Abnormal   Collection Time: 01/16/19  6:37 PM  Result Value Ref Range   SARS Coronavirus 2 Ag Positive (A) Negative     ASSESSMENT & PLAN:  1. COVID-19 virus infection   2. Suspected COVID-19 virus infection   3. Exposure to COVID-19 virus     Meds ordered this encounter  Medications  . cetirizine (ZYRTEC) 10 MG chewable tablet    Sig: Chew 1 tablet (10 mg total) by mouth daily.    Dispense:  20 tablet    Refill:  0    Order Specific Question:   Supervising Provider    Answer:   Raylene Everts [[id number]]  . fluticasone (FLONASE) 50 MCG/ACT nasal spray    Sig: Place 2 sprays into both nostrils daily.    Dispense:  16 g    Refill:  0    Order Specific Question:   Supervising Provider    Answer:   Raylene Everts [[id number]]  . benzonatate (TESSALON) 100 MG capsule    Sig: Take 1 capsule (100 mg total) by mouth every 8 (eight) hours.    Dispense:  21 capsule    Refill:  0    Order Specific Question:   Supervising Provider    Answer:   Raylene Everts [[id number]]    COVID test was positive You should remain isolated in your home for 10 days from symptom onset AND greater than 72 hours after symptoms resolution (absence of fever without the use of fever-reducing medication and improvement in respiratory symptoms), whichever is longer Get plenty of rest and push fluids Use zyrtec for nasal congestion, runny nose, and/or sore throat Use flonase for nasal congestion and runny nose Use medications daily for symptom relief Use OTC  medications like ibuprofen or tylenol as needed fever or pain Follow up with PCP in 1-2 days via phone or e-visit for recheck and to ensure symptoms are improving Call or go to the ED if you have any new or worsening symptoms such as fever, worsening cough, shortness of breath, chest tightness, chest pain, turning blue, changes in mental status, etc...    Reviewed expectations re: course of current medical issues. Questions answered. Outlined signs and symptoms indicating need for more acute intervention. Patient verbalized understanding. After Visit Summary given.         Lestine Box, PA-C 01/16/19 2010

## 2019-02-24 ENCOUNTER — Other Ambulatory Visit: Payer: Self-pay | Admitting: Obstetrics & Gynecology

## 2019-07-07 ENCOUNTER — Encounter: Payer: Self-pay | Admitting: Emergency Medicine

## 2019-07-07 ENCOUNTER — Ambulatory Visit
Admission: EM | Admit: 2019-07-07 | Discharge: 2019-07-07 | Disposition: A | Discharge status: Home / Self Care | Payer: PRIVATE HEALTH INSURANCE | Attending: Emergency Medicine | Admitting: Emergency Medicine

## 2019-07-07 ENCOUNTER — Other Ambulatory Visit: Payer: Self-pay

## 2019-07-07 DIAGNOSIS — G8929 Other chronic pain: Secondary | ICD-10-CM

## 2019-07-07 DIAGNOSIS — M545 Low back pain: Secondary | ICD-10-CM

## 2019-07-07 MED ORDER — ACETAMINOPHEN 500 MG PO TABS: 500.0000 mg | ORAL_TABLET | Freq: Four times a day (QID) | ORAL | 0 refills | Status: AC | PRN

## 2019-07-07 MED ORDER — CYCLOBENZAPRINE HCL 7.5 MG PO TABS: 7.5000 mg | ORAL_TABLET | Freq: Three times a day (TID) | ORAL | 0 refills | Status: AC | PRN

## 2019-07-07 MED ORDER — DEXAMETHASONE SODIUM PHOSPHATE 10 MG/ML IJ SOLN
10.0000 mg | Freq: Once | INTRAMUSCULAR | Status: AC
  Administered 2019-07-07: 10 mg via INTRAMUSCULAR

## 2019-07-07 MED ORDER — KETOROLAC TROMETHAMINE 60 MG/2ML IM SOLN
60.0000 mg | Freq: Once | INTRAMUSCULAR | Status: AC
  Administered 2019-07-07: 60 mg via INTRAMUSCULAR

## 2019-07-07 MED ORDER — PREDNISONE 10 MG (21) PO TBPK: ORAL_TABLET | Freq: Every day | ORAL | 0 refills | Status: DC

## 2019-07-07 NOTE — Discharge Instructions (Addendum)
Rest, ice and heat as needed Ensure adequate ROM as tolerated. Prescribed Tylenol as needed for pain relief Prescribed prednisone for inflammation Prescribed flexeril  for muscle spasm.  Do not drive or operate heavy machinery while taking this medication Return here or go to ER if you have any new or worsening symptoms such as numbness/tingling of the inner thighs, loss of bladder or bowel control, headache/blurry vision, nausea/vomiting, confusion/altered mental status, dizziness, weakness, passing out, imbalance, etc..Marland Kitchen

## 2019-07-07 NOTE — ED Triage Notes (Signed)
Pt sts chronic lower back pain that has been worse over last couple of days with radiation to left leg

## 2019-07-07 NOTE — ED Provider Notes (Signed)
[facility]    CSN: [id number] Arrival date & time: 07/07/19  1556      History   Chief Complaint Chief Complaint  Patient presents with  . Back Pain    HPI Yvonne Mckee is a 54 y.o. female.   Presented to the urgent care for complaint of chronic back pain for the past few days. Denies any precipitating event.  Localized pain to her low back.  Described the pain as constant and achy, rated 7 on a scale 1-10.  She has tried OTC medications without relief.  Her symptoms are made worse with ROM.  She denies similar symptoms in the past.  Denies chills, fever, nausea, vomiting, diarrhea.   The history is provided by the patient. No language interpreter was used.  Back Pain   Past Medical History:  Diagnosis Date  . Anemia   . Anxiety   . Calculus of gallbladder with acute cholecystitis, without mention of obstruction 03/30/2012  . Depression   . Depression 03/30/2012  . Diabetes mellitus without complication ([place])   . GERD (gastroesophageal reflux disease)   . Hypertension   . Menometrorrhagia   . Sciatica   . Sciatica 03/30/2012  . Severe obesity (BMI >= 40) ([place]) 03/30/2012    Patient Active Problem List   Diagnosis Date Noted  . Diabetes ([place]) 02/23/2018  . Abdominal wall hematoma 01/08/2017  . S/P TAH-BSO 12/22/2016  . Calculus of gallbladder with acute cholecystitis, without mention of obstruction 03/30/2012  . Depression 03/30/2012  . Sciatica 03/30/2012  . Severe obesity (BMI >= 40) ([place]) 03/30/2012  . BACK PAIN, LUMBAR 07/24/2008  . OTITIS MEDIA, ACUTE 07/08/2008  . CELLULITIS, FINGER 07/08/2008  . OBESITY 06/17/2008  . UPPER RESPIRATORY INFECTION, VIRAL 05/14/2008  . Hypertension associated with diabetes ([place]) 06/07/2007  . FLUID RETENTION 06/02/2006  . ELEVATED BLOOD PRESSURE WITHOUT DIAGNOSIS OF HYPERTENSION 04/25/2006  . ANXIETY 04/22/2006  . DEPRESSION 04/22/2006  . MIGRAINE HEADACHE 04/22/2006  . GERD 04/22/2006    Past Surgical  History:  Procedure Laterality Date  . ABDOMINAL HYSTERECTOMY N/A 12/22/2016   Procedure: HYSTERECTOMY ABDOMINAL;  Surgeon: Florian Buff, MD;  Location: [facility];  Service: Gynecology;  Laterality: N/A;  . CESAREAN SECTION     x 2  . CHOLECYSTECTOMY N/A 03/29/2012   Procedure: LAPAROSCOPIC CHOLECYSTECTOMY WITH INTRAOPERATIVE CHOLANGIOGRAM;  Surgeon: Joyice Faster. Cornett, MD;  Location: [facility];  Service: General;  Laterality: N/A;  . SALPINGOOPHORECTOMY Bilateral 12/22/2016   Procedure: BILATERAL SALPINGO OOPHORECTOMY;  Surgeon: Florian Buff, MD;  Location: [facility];  Service: Gynecology;  Laterality: Bilateral;  . TONSILLECTOMY    . TUBAL LIGATION      OB History    Gravida  2   Para  2   Term  2   Preterm      AB      Living  2     SAB      TAB      Ectopic      Multiple      Live Births  2            Home Medications    Prior to Admission medications   Medication Sig Start Date End Date Taking? Authorizing Provider  acetaminophen (TYLENOL) 500 MG tablet Take 1 tablet (500 mg total) by mouth every 6 (six) hours as needed. 07/07/19   Charleston Vierling, Darrelyn Hillock, FNP  aspirin 81 MG chewable tablet Chew 81 mg by mouth at bedtime.  [provider]  aspirin 81 MG chewable tablet Chew by mouth.    [provider]  benzonatate (TESSALON) 100 MG capsule Take 1 capsule (100 mg total) by mouth every 8 (eight) hours. 01/16/19   Wurst, [place], PA-C  cetirizine (ZYRTEC) 10 MG chewable tablet Chew 1 tablet (10 mg total) by mouth daily. 01/16/19   Wurst, [place], PA-C  cyclobenzaprine (FEXMID) 7.5 MG tablet Take 1 tablet (7.5 mg total) by mouth 3 (three) times daily as needed for muscle spasms. 07/07/19   Pavneet Markwood, Darrelyn Hillock, FNP  diphenhydrAMINE (BENADRYL) 25 mg capsule Take 50 mg by mouth at bedtime.    [provider]  diphenhydrAMINE (BENADRYL) 25 mg capsule Take by mouth.    [provider]  estradiol (ESTRACE) 2 MG tablet Take 1 tablet (2 mg  total) by mouth daily. 02/17/17   Florian Buff, MD  estradiol (ESTRACE) 2 MG tablet Take by mouth. 02/17/17   [provider]  estradiol (ESTRACE) 2 MG tablet TAKE 1 TABLET BY MOUTH ONCE DAILY 01/16/18   Florian Buff, MD  estradiol (ESTRACE) 2 MG tablet Take 1 tablet by mouth once daily 02/26/19   Florian Buff, MD  FLUoxetine (PROZAC) 40 MG capsule Take 40 mg by mouth at bedtime.     [provider]  FLUoxetine (PROZAC) 40 MG capsule Take by mouth. 11/14/17   [provider]  fluticasone (FLONASE) 50 MCG/ACT nasal spray Place 2 sprays into both nostrils daily. 01/16/19   Wurst, [place], PA-C  hydrochlorothiazide (HYDRODIURIL) 25 MG tablet Take 25 mg by mouth daily.    [provider]  hydrochlorothiazide (HYDRODIURIL) 25 MG tablet Take by mouth. 10/01/17   [provider]  lansoprazole (PREVACID) 15 MG capsule Take 20 mg by mouth daily.     [provider]  metFORMIN (GLUCOPHAGE-XR) 500 MG 24 hr tablet Take 500 mg by mouth 2 (two) times daily. 07/21/17   [provider]  metFORMIN (GLUCOPHAGE-XR) 500 MG 24 hr tablet Take by mouth. 07/21/17   [provider]  pantoprazole (PROTONIX) 40 MG tablet Take by mouth. 11/14/17   [provider]  predniSONE (STERAPRED UNI-PAK 21 TAB) 10 MG (21) TBPK tablet Take by mouth daily. Take 6 tabs by mouth daily  for 2 days, then 5 tabs for 2 days, then 4 tabs for 2 days, then 3 tabs for 2 days, 2 tabs for 2 days, then 1 tab by mouth daily for 2 days 07/07/19   Emerson Monte, FNP    Family History Family History  Problem Relation Age of Onset  . Asthma Daughter   . Diabetes Maternal Grandmother   . Hypertension Maternal Grandmother   . Congestive Heart Failure Maternal Grandmother   . Emphysema Maternal Grandmother   . Cancer Maternal Grandmother        lung  . Other Father        heart problems  . Hypertension Mother   . Other Mother        hardening of heart  . Hypertension  Brother   . Diabetes Brother   . Other Son        born with lypomengeocele    Social History Social History   Tobacco Use  . Smoking status: Never Smoker  . Smokeless tobacco: Never Used  Substance Use Topics  . Alcohol use: Yes    Comment: very seldom  . Drug use: No     Allergies   Bupropion hcl, Squid oil,  and [employer] wort   Review of Systems Review of Systems  Constitutional: Negative.   Respiratory: Negative.   Cardiovascular: Negative.   Musculoskeletal: Positive for back pain.  All other systems reviewed and are negative.    Physical Exam Triage Vital Signs ED Triage Vitals  Enc Vitals Group     BP 07/07/19 1602 (!) 177/75     Pulse Rate 07/07/19 1602 86     Resp 07/07/19 1602 18     Temp 07/07/19 1602 98.1 F (36.7 C)     Temp Source 07/07/19 1602 Oral     SpO2 07/07/19 1602 97 %     Weight --      Height --      Head Circumference --      Peak Flow --      Pain Score 07/07/19 1603 8     Pain Loc --      Pain Edu? --      Excl. in [place]? --    No data found.  Updated Vital Signs BP (!) 177/75 (BP Location: Right Arm)   Pulse 86   Temp 98.1 F (36.7 C) (Oral)   Resp 18   LMP 11/26/2016   SpO2 97%   Visual Acuity Right Eye Distance:   Left Eye Distance:   Bilateral Distance:    Right Eye Near:   Left Eye Near:    Bilateral Near:     Physical Exam Vitals and nursing note reviewed.  Constitutional:      General: She is not in acute distress.    Appearance: Normal appearance. She is normal weight. She is not ill-appearing, toxic-appearing or diaphoretic.  Cardiovascular:     Rate and Rhythm: Normal rate and regular rhythm.     Pulses: Normal pulses.     Heart sounds: Normal heart sounds. No murmur. No friction rub. No gallop.   Pulmonary:     Effort: Pulmonary effort is normal. No respiratory distress.     Breath sounds: Normal breath sounds. No stridor. No wheezing, rhonchi or rales.  Chest:     Chest wall: No tenderness.    Musculoskeletal:        General: Tenderness present. No swelling or deformity. Normal range of motion.     Lumbar back: Spasms and tenderness present.  Neurological:     General: No focal deficit present.     Mental Status: She is alert and oriented to person, place, and time.     Cranial Nerves: No cranial nerve deficit.     Sensory: No sensory deficit.     Motor: No weakness.     Coordination: Coordination normal.     Gait: Gait normal.     Deep Tendon Reflexes: Reflexes normal.      UC Treatments / Results  Labs (all labs ordered are listed, but only abnormal results are displayed) Labs Reviewed - No data to display  EKG   Radiology No results found.  Procedures Procedures (including critical care time)  Medications Ordered in UC Medications  ketorolac (TORADOL) injection 60 mg (60 mg Intramuscular Given 07/07/19 1614)  dexamethasone (DECADRON) injection 10 mg (10 mg Intramuscular Given 07/07/19 1614)    Initial Impression / Assessment and Plan / UC Course  I have reviewed the triage vital signs and the nursing notes.  Pertinent labs & imaging results that were available during my care of the patient were reviewed by me and considered in my medical decision making (see chart for details).  Patient is stable at discharge.  Symptoms consistent with chronic back pain.  Will prescribe Flexeril, prednisone, Tylenol as needed for pain management.   final Clinical Impressions(s) / UC Diagnoses   Final diagnoses:  Acute bilateral low back pain without sciatica     Discharge Instructions     Rest, ice and heat as needed Ensure adequate ROM as tolerated. Prescribed Tylenol as needed for pain relief Prescribed prednisone for inflammation Prescribed flexeril  for muscle spasm.  Do not drive or operate heavy machinery while taking this medication Return here or go to ER if you have any new or worsening symptoms such as numbness/tingling of the inner thighs, loss of  bladder or bowel control, headache/blurry vision, nausea/vomiting, confusion/altered mental status, dizziness, weakness, passing out, imbalance, etc...      ED Prescriptions    Medication Sig Dispense Auth. Provider   cyclobenzaprine (FEXMID) 7.5 MG tablet Take 1 tablet (7.5 mg total) by mouth 3 (three) times daily as needed for muscle spasms. 30 tablet Fahima Cifelli S, FNP   predniSONE (STERAPRED UNI-PAK 21 TAB) 10 MG (21) TBPK tablet Take by mouth daily. Take 6 tabs by mouth daily  for 2 days, then 5 tabs for 2 days, then 4 tabs for 2 days, then 3 tabs for 2 days, 2 tabs for 2 days, then 1 tab by mouth daily for 2 days 42 tablet Dixon Luczak, Darrelyn Hillock, FNP   acetaminophen (TYLENOL) 500 MG tablet Take 1 tablet (500 mg total) by mouth every 6 (six) hours as needed. 30 tablet Jaymon Dudek, Darrelyn Hillock, FNP     PDMP not reviewed this encounter.   Emerson Monte, [place] 07/07/19 1621

## 2019-11-20 ENCOUNTER — Other Ambulatory Visit: Payer: Self-pay

## 2019-11-20 ENCOUNTER — Ambulatory Visit
Admission: EM | Admit: 2019-11-20 | Discharge: 2019-11-20 | Disposition: A | Discharge status: Home / Self Care | Payer: PRIVATE HEALTH INSURANCE | Attending: Emergency Medicine | Admitting: Emergency Medicine

## 2019-11-20 DIAGNOSIS — Z1152 Encounter for screening for COVID-19: Secondary | ICD-10-CM

## 2019-11-20 NOTE — ED Triage Notes (Signed)
Needs covid test for exposure  

## 2019-11-22 LAB — NOVEL CORONAVIRUS, NAA

## 2019-11-23 ENCOUNTER — Ambulatory Visit
Admission: EM | Admit: 2019-11-23 | Discharge: 2019-11-23 | Disposition: A | Discharge status: Home / Self Care | Payer: PRIVATE HEALTH INSURANCE | Attending: Emergency Medicine | Admitting: Emergency Medicine

## 2019-11-23 ENCOUNTER — Other Ambulatory Visit: Payer: Self-pay

## 2019-11-23 DIAGNOSIS — Z1152 Encounter for screening for COVID-19: Secondary | ICD-10-CM

## 2019-11-23 NOTE — ED Triage Notes (Signed)
Needs recollect of covid specimen

## 2019-11-26 LAB — NOVEL CORONAVIRUS, NAA

## 2020-01-18 ENCOUNTER — Ambulatory Visit (INDEPENDENT_AMBULATORY_CARE_PROVIDER_SITE_OTHER): Payer: PRIVATE HEALTH INSURANCE | Admitting: Obstetrics & Gynecology

## 2020-01-18 ENCOUNTER — Other Ambulatory Visit: Payer: Self-pay

## 2020-01-18 ENCOUNTER — Encounter: Payer: Self-pay | Admitting: Obstetrics & Gynecology

## 2020-01-18 VITALS — BP 127/72 | HR 80 | Ht 62.0 in | Wt 200.5 lb

## 2020-01-18 DIAGNOSIS — N941 Unspecified dyspareunia: Secondary | ICD-10-CM | POA: Diagnosis not present

## 2020-01-18 DIAGNOSIS — N952 Postmenopausal atrophic vaginitis: Secondary | ICD-10-CM

## 2020-01-18 MED ORDER — ESTRADIOL 0.1 MG/GM VA CREA: TOPICAL_CREAM | VAGINAL | 12 refills | Status: AC

## 2020-01-18 NOTE — Progress Notes (Signed)
Chief Complaint  Patient presents with  . Follow-up    wants hormones checked and having pain with sex      54 y.o. [id number] Patient's last menstrual period was 11/26/2016. The current method of family planning is status post hysterectomy.  Outpatient Encounter Medications as of 01/18/2020  Medication Sig  . acetaminophen (TYLENOL) 500 MG tablet Take 1 tablet (500 mg total) by mouth every 6 (six) hours as needed.  Marland Kitchen aspirin 81 MG chewable tablet Chew 81 mg by mouth at bedtime.   . cyclobenzaprine (FEXMID) 7.5 MG tablet Take 1 tablet (7.5 mg total) by mouth 3 (three) times daily as needed for muscle spasms.  Marland Kitchen estradiol (ESTRACE) 2 MG tablet Take 1 tablet (2 mg total) by mouth daily.  Marland Kitchen FLUoxetine (PROZAC) 40 MG capsule Take 40 mg by mouth at bedtime.   . metFORMIN (GLUCOPHAGE-XR) 500 MG 24 hr tablet Take 500 mg by mouth 2 (two) times daily.  Marland Kitchen olmesartan-hydrochlorothiazide (BENICAR HCT) 40-25 MG tablet Take 1 tablet by mouth daily.  Marland Kitchen omeprazole (PRILOSEC) 40 MG capsule Take 40 mg by mouth daily.  Marland Kitchen estradiol (ESTRACE) 0.1 MG/GM vaginal cream Use 1 grams every other night  . [DISCONTINUED] aspirin 81 MG chewable tablet Chew by mouth. (Patient not taking: Reported on 01/18/2020)  . [DISCONTINUED] benzonatate (TESSALON) 100 MG capsule Take 1 capsule (100 mg total) by mouth every 8 (eight) hours. (Patient not taking: Reported on 01/18/2020)  . [DISCONTINUED] cetirizine (ZYRTEC) 10 MG chewable tablet Chew 1 tablet (10 mg total) by mouth daily. (Patient not taking: Reported on 01/18/2020)  . [DISCONTINUED] diphenhydrAMINE (BENADRYL) 25 mg capsule Take 50 mg by mouth at bedtime. (Patient not taking: Reported on 01/18/2020)  . [DISCONTINUED] diphenhydrAMINE (BENADRYL) 25 mg capsule Take by mouth. (Patient not taking: Reported on 01/18/2020)  . [DISCONTINUED] estradiol (ESTRACE) 2 MG tablet Take by mouth.  . [DISCONTINUED] estradiol (ESTRACE) 2 MG tablet TAKE 1 TABLET BY MOUTH ONCE DAILY  .  [DISCONTINUED] estradiol (ESTRACE) 2 MG tablet Take 1 tablet by mouth once daily  . [DISCONTINUED] FLUoxetine (PROZAC) 40 MG capsule Take by mouth.  . [DISCONTINUED] fluticasone (FLONASE) 50 MCG/ACT nasal spray Place 2 sprays into both nostrils daily.  . [DISCONTINUED] hydrochlorothiazide (HYDRODIURIL) 25 MG tablet Take 25 mg by mouth daily.  . [DISCONTINUED] hydrochlorothiazide (HYDRODIURIL) 25 MG tablet Take by mouth.  . [DISCONTINUED] lansoprazole (PREVACID) 15 MG capsule Take 20 mg by mouth daily.   . [DISCONTINUED] metFORMIN (GLUCOPHAGE-XR) 500 MG 24 hr tablet Take by mouth.  . [DISCONTINUED] pantoprazole (PROTONIX) 40 MG tablet Take by mouth.  . [DISCONTINUED] predniSONE (STERAPRED UNI-PAK 21 TAB) 10 MG (21) TBPK tablet Take by mouth daily. Take 6 tabs by mouth daily  for 2 days, then 5 tabs for 2 days, then 4 tabs for 2 days, then 3 tabs for 2 days, 2 tabs for 2 days, then 1 tab by mouth daily for 2 days   Facility-Administered Encounter Medications as of 01/18/2020  Medication  . triamcinolone acetonide (KENALOG-40) injection 40 mg    Subjective Pt with 3-6 months of discomfort with insertion during intercourse Uses lubrication occasionally Taking her oral estradiol 2 mg daily Past Medical History:  Diagnosis Date  . Anemia   . Anxiety   . Calculus of gallbladder with acute cholecystitis, without mention of obstruction 03/30/2012  . Depression   . Depression 03/30/2012  . Diabetes mellitus without complication ([place])   . GERD (gastroesophageal reflux disease)   . Hypertension   .  Menometrorrhagia   . Sciatica   . Sciatica 03/30/2012  . Severe obesity (BMI >= 40) ([place]) 03/30/2012    Past Surgical History:  Procedure Laterality Date  . ABDOMINAL HYSTERECTOMY N/A 12/22/2016   Procedure: HYSTERECTOMY ABDOMINAL;  Surgeon: Florian Buff, MD;  Location: [facility];  Service: Gynecology;  Laterality: N/A;  . CESAREAN SECTION     x 2  . CHOLECYSTECTOMY N/A 03/29/2012   Procedure:  LAPAROSCOPIC CHOLECYSTECTOMY WITH INTRAOPERATIVE CHOLANGIOGRAM;  Surgeon: Joyice Faster. Cornett, MD;  Location: [facility];  Service: General;  Laterality: N/A;  . SALPINGOOPHORECTOMY Bilateral 12/22/2016   Procedure: BILATERAL SALPINGO OOPHORECTOMY;  Surgeon: Florian Buff, MD;  Location: [facility];  Service: Gynecology;  Laterality: Bilateral;  . TONSILLECTOMY    . TUBAL LIGATION      OB History    Gravida  2   Para  2   Term  2   Preterm      AB      Living  2     SAB      TAB      Ectopic      Multiple      Live Births  2           Allergies  Allergen Reactions  . Bupropion Hcl Other (See Comments)    Unknown reaction: Pt states that she remembers welts post taking medication  . Squid Oil Swelling  . St Johns Wort Rash    Social History   Socioeconomic History  . Marital status: Divorced    Spouse name: Not on file  . Number of children: Not on file  . Years of education: Not on file  . Highest education level: Not on file  Occupational History  . Not on file  Tobacco Use  . Smoking status: Never Smoker  . Smokeless tobacco: Never Used  Vaping Use  . Vaping Use: Never used  Substance and Sexual Activity  . Alcohol use: Yes    Comment: very seldom  . Drug use: No  . Sexual activity: Not Currently    Birth control/protection: Surgical    Comment: hyst  Other Topics Concern  . Not on file  Social History Narrative  . Not on file   Social Determinants of Health   Financial Resource Strain:   . Difficulty of Paying Living Expenses: Not on file  Food Insecurity:   . Worried About Charity fundraiser in the Last Year: Not on file  . Ran Out of Food in the Last Year: Not on file  Transportation Needs:   . Lack of Transportation (Medical): Not on file  . Lack of Transportation (Non-Medical): Not on file  Physical Activity:   . Days of Exercise per Week: Not on file  . Minutes of Exercise per Session: Not on file  Stress:   . Feeling of Stress : Not  on file  Social Connections:   . Frequency of Communication with Friends and Family: Not on file  . Frequency of Social Gatherings with Friends and Family: Not on file  . Attends Religious Services: Not on file  . Active Member of Clubs or Organizations: Not on file  . Attends Archivist Meetings: Not on file  . Marital Status: Not on file    Family History  Problem Relation Age of Onset  . Asthma Daughter   . Diabetes Maternal Grandmother   . Hypertension Maternal Grandmother   . Congestive Heart Failure Maternal Grandmother   .  Emphysema Maternal Grandmother   . Cancer Maternal Grandmother        lung  . Other Father        heart problems  . Hypertension Mother   . Other Mother        hardening of heart  . Hypertension Brother   . Diabetes Brother   . Other Son        born with lypomengeocele    Medications:       Current Outpatient Medications:  .  acetaminophen (TYLENOL) 500 MG tablet, Take 1 tablet (500 mg total) by mouth every 6 (six) hours as needed., Disp: 30 tablet, Rfl: 0 .  aspirin 81 MG chewable tablet, Chew 81 mg by mouth at bedtime. , Disp: , Rfl:  .  cyclobenzaprine (FEXMID) 7.5 MG tablet, Take 1 tablet (7.5 mg total) by mouth 3 (three) times daily as needed for muscle spasms., Disp: 30 tablet, Rfl: 0 .  estradiol (ESTRACE) 2 MG tablet, Take 1 tablet (2 mg total) by mouth daily., Disp: 30 tablet, Rfl: 11 .  FLUoxetine (PROZAC) 40 MG capsule, Take 40 mg by mouth at bedtime. , Disp: , Rfl:  .  metFORMIN (GLUCOPHAGE-XR) 500 MG 24 hr tablet, Take 500 mg by mouth 2 (two) times daily., Disp: , Rfl: 6 .  olmesartan-hydrochlorothiazide (BENICAR HCT) 40-25 MG tablet, Take 1 tablet by mouth daily., Disp: , Rfl:  .  omeprazole (PRILOSEC) 40 MG capsule, Take 40 mg by mouth daily., Disp: , Rfl:  .  estradiol (ESTRACE) 0.1 MG/GM vaginal cream, Use 1 grams every other night, Disp: 30 g, Rfl: 12  Current Facility-Administered Medications:  .  triamcinolone  acetonide (KENALOG-40) injection 40 mg, 40 mg, Intramuscular, Once, Bartorelli, Monica, NP  Objective Blood pressure 127/72, pulse 80, height 5\' 2"  (1.575 m), weight 200 lb 8 oz (90.9 kg), last menstrual period 11/26/2016.  Gen WDWN NAD  Pertinent ROS No burning with urination, frequency or urgency No nausea, vomiting or diarrhea Nor fever chills or other constitutional symptoms   Labs or studies     Impression Diagnoses this Encounter::   ICD-10-CM   1. Dyspareunia, female  N94.10   2. Atrophic vaginitis  N95.2     Established relevant diagnosis(es):   Plan/Recommendations: Meds ordered this encounter  Medications  . estradiol (ESTRACE) 0.1 MG/GM vaginal cream    Sig: Use 1 grams every other night    Dispense:  30 g    Refill:  12    Labs or Scans Ordered: No orders of the defined types were placed in this encounter.   Management:: Add vaginal estrogen to the oral estrogen to improve vaginal tissue, will probably take 3 months to improve, continue to use lubrication everytime  Follow up Return if symptoms worsen or fail to improve.        Face to face time:  20 minutes  Greater than 50% of the visit time was spent in counseling and coordination of care with the patient.  The summary and outline of the counseling and care coordination is summarized in the note above.   All questions were answered.

## 2020-05-21 ENCOUNTER — Other Ambulatory Visit: Payer: Self-pay | Admitting: Obstetrics & Gynecology

## 2021-04-04 ENCOUNTER — Ambulatory Visit
Admission: EM | Admit: 2021-04-04 | Discharge: 2021-04-04 | Disposition: A | Discharge status: Home / Self Care | Payer: No Typology Code available for payment source | Attending: Urgent Care | Admitting: Urgent Care

## 2021-04-04 ENCOUNTER — Other Ambulatory Visit: Payer: Self-pay

## 2021-04-04 DIAGNOSIS — R200 Anesthesia of skin: Secondary | ICD-10-CM | POA: Diagnosis not present

## 2021-04-04 DIAGNOSIS — R202 Paresthesia of skin: Secondary | ICD-10-CM | POA: Diagnosis not present

## 2021-04-04 DIAGNOSIS — M79642 Pain in left hand: Secondary | ICD-10-CM

## 2021-04-04 DIAGNOSIS — E119 Type 2 diabetes mellitus without complications: Secondary | ICD-10-CM | POA: Diagnosis not present

## 2021-04-04 MED ORDER — NAPROXEN 500 MG PO TABS: 500.0000 mg | ORAL_TABLET | Freq: Two times a day (BID) | ORAL | 0 refills | Status: DC

## 2021-04-04 NOTE — ED Provider Notes (Signed)
[place]   MRN: [id number] DOB: 06/03/1965  Subjective:   Yvonne Mckee is a 56 y.o. female presenting for 1 day history of acute onset persistent moderate to severe left hand pain, multiple pain across the fingers with intermittent burning sensations, numbness and tingling of the palmar surface and dorsal aspect of the fingers.  Has also had intermittent swelling.  Patient has concerns about carpal tunnel syndrome.  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  She is a type II diabetic, is well controlled without insulin.  No fall, trauma.  No history of diabetic peripheral neuropathy.   Current Facility-Administered Medications:    triamcinolone acetonide (KENALOG-40) injection 40 mg, 40 mg, Intramuscular, Once, Maury Dus, NP  Current Outpatient Medications:    acetaminophen (TYLENOL) 500 MG tablet, Take 1 tablet (500 mg total) by mouth every 6 (six) hours as needed., Disp: 30 tablet, Rfl: 0   aspirin 81 MG chewable tablet, Chew 81 mg by mouth at bedtime. , Disp: , Rfl:    cyclobenzaprine (FEXMID) 7.5 MG tablet, Take 1 tablet (7.5 mg total) by mouth 3 (three) times daily as needed for muscle spasms., Disp: 30 tablet, Rfl: 0   estradiol (ESTRACE) 0.1 MG/GM vaginal cream, Use 1 grams every other night, Disp: 30 g, Rfl: 12   estradiol (ESTRACE) 2 MG tablet, Take 1 tablet by mouth once daily, Disp: 90 tablet, Rfl: 3   FLUoxetine (PROZAC) 40 MG capsule, Take 40 mg by mouth at bedtime. , Disp: , Rfl:    metFORMIN (GLUCOPHAGE-XR) 500 MG 24 hr tablet, Take 500 mg by mouth 2 (two) times daily., Disp: , Rfl: 6   olmesartan-hydrochlorothiazide (BENICAR HCT) 40-25 MG tablet, Take 1 tablet by mouth daily., Disp: , Rfl:    omeprazole (PRILOSEC) 40 MG capsule, Take 40 mg by mouth daily., Disp: , Rfl:    Allergies  Allergen Reactions   Bupropion Hcl Other (See Comments)    Unknown reaction: Pt states that she remembers welts  post taking medication   Squid Oil Swelling   St Johns Wort Rash    Past Medical History:  Diagnosis Date   Anemia    Anxiety    Calculus of gallbladder with acute cholecystitis, without mention of obstruction 03/30/2012   Depression    Depression 03/30/2012   Diabetes mellitus without complication ([facility])    GERD (gastroesophageal reflux disease)    Hypertension    Menometrorrhagia    Sciatica    Sciatica 03/30/2012   Severe obesity (BMI >= 40) ([place]) 03/30/2012     Past Surgical History:  Procedure Laterality Date   ABDOMINAL HYSTERECTOMY N/A 12/22/2016   Procedure: HYSTERECTOMY ABDOMINAL;  Surgeon: Florian Buff, MD;  Location: [facility];  Service: Gynecology;  Laterality: N/A;   CESAREAN SECTION     x 2   CHOLECYSTECTOMY N/A 03/29/2012   Procedure: LAPAROSCOPIC CHOLECYSTECTOMY WITH INTRAOPERATIVE CHOLANGIOGRAM;  Surgeon: Joyice Faster. Cornett, MD;  Location: [facility];  Service: General;  Laterality: N/A;   SALPINGOOPHORECTOMY Bilateral 12/22/2016   Procedure: BILATERAL SALPINGO OOPHORECTOMY;  Surgeon: Florian Buff, MD;  Location: [facility];  Service: Gynecology;  Laterality: Bilateral;   TONSILLECTOMY     TUBAL LIGATION      Family History  Problem Relation Age of Onset   Asthma Daughter    Diabetes Maternal Grandmother    Hypertension Maternal Grandmother    Congestive Heart Failure Maternal Grandmother    Emphysema Maternal  Grandmother    Cancer Maternal Grandmother        lung   Other Father        heart problems   Hypertension Mother    Other Mother        hardening of heart   Hypertension Brother    Diabetes Brother    Other Son        born with lypomengeocele    Social History   Tobacco Use   Smoking status: Never   Smokeless tobacco: Never  Vaping Use   Vaping Use: Never used  Substance Use Topics   Alcohol use: Yes    Comment: very seldom   Drug use: No    ROS   Objective:   Vitals: BP 125/75 (BP Location: Right Arm)    Pulse 73    Temp 98.5 F (36.9  C) (Oral)    Resp 18    LMP 11/26/2016    SpO2 97%   Physical Exam Constitutional:      General: She is not in acute distress.    Appearance: Normal appearance. She is well-developed. She is not ill-appearing, toxic-appearing or diaphoretic.  HENT:     Head: Normocephalic and atraumatic.     Nose: Nose normal.     Mouth/Throat:     Mouth: Mucous membranes are moist.  Eyes:     General: No scleral icterus.       Right eye: No discharge.        Left eye: No discharge.     Extraocular Movements: Extraocular movements intact.  Cardiovascular:     Rate and Rhythm: Normal rate.  Pulmonary:     Effort: Pulmonary effort is normal.  Musculoskeletal:     Left hand: Tenderness (throughout) present. No swelling, deformity, lacerations or bony tenderness. Decreased range of motion (secondary to pain and guarding, full passive ROM). Normal strength. Normal sensation. Normal capillary refill.     Comments: Negative Tinel's and Phalen's.   Skin:    General: Skin is warm and dry.  Neurological:     General: No focal deficit present.     Mental Status: She is alert and oriented to person, place, and time.  Psychiatric:        Mood and Affect: Mood normal.        Behavior: Behavior normal.    Assessment and Plan :   PDMP not reviewed this encounter.  1. Left hand pain   2. Type 2 diabetes mellitus treated without insulin ([place])   3. Numbness and tingling in left hand    Low suspicion for carpal tunnel syndrome but as patient is very concerned for this discussed management.  Suspect inflammatory pain secondary to the nature of her work that she has done for over 20 years.  Offered her an oral steroid given severity of the pain but patient does not want to do this.  I am agreeable and therefore recommended wrist splinting, naproxen for pain and inflammation, follow-up with Ortho.  Deferred imaging as there is no trauma or physical exam findings warranting this. Counseled patient on potential for  adverse effects with medications prescribed/recommended today, ER and return-to-clinic precautions discussed, patient verbalized understanding.    Jaynee Eagles, PA-C 04/04/21 1539

## 2021-04-04 NOTE — ED Triage Notes (Signed)
Pt reports she can not make a fist with the left hand since yesterday. Pt think she has Carpal tunnel.

## 2021-07-21 ENCOUNTER — Other Ambulatory Visit: Payer: Self-pay | Admitting: Obstetrics & Gynecology

## 2021-12-04 ENCOUNTER — Ambulatory Visit (INDEPENDENT_AMBULATORY_CARE_PROVIDER_SITE_OTHER): Payer: No Typology Code available for payment source

## 2021-12-04 ENCOUNTER — Ambulatory Visit (HOSPITAL_COMMUNITY)
Admission: EM | Admit: 2021-12-04 | Discharge: 2021-12-04 | Disposition: A | Discharge status: Home / Self Care | Payer: No Typology Code available for payment source | Attending: Internal Medicine | Admitting: Internal Medicine

## 2021-12-04 ENCOUNTER — Encounter (HOSPITAL_COMMUNITY): Payer: Self-pay

## 2021-12-04 DIAGNOSIS — G5601 Carpal tunnel syndrome, right upper limb: Secondary | ICD-10-CM | POA: Diagnosis not present

## 2021-12-04 DIAGNOSIS — M79641 Pain in right hand: Secondary | ICD-10-CM

## 2021-12-04 MED ORDER — NAPROXEN 500 MG PO TABS: 500.0000 mg | ORAL_TABLET | Freq: Two times a day (BID) | ORAL | 0 refills | Status: AC

## 2021-12-04 MED ORDER — IBUPROFEN 800 MG PO TABS
ORAL_TABLET | ORAL | Status: AC
  Filled 2021-12-04: qty 1

## 2021-12-04 MED ORDER — IBUPROFEN 800 MG PO TABS
800.0000 mg | ORAL_TABLET | Freq: Once | ORAL | Status: AC
  Administered 2021-12-04: 800 mg via ORAL

## 2021-12-04 NOTE — ED Triage Notes (Signed)
Pt c/o rt hand pain since last night. Denies injury. States she did hit it a few months ago. Denies taking any meds. Requesting xray.

## 2021-12-04 NOTE — Discharge Instructions (Addendum)
Your right hand pain is likely related to median nerve compression and carpal tunnel syndrome.  Wear the wrist brace at nighttime and during the daytime if needed, but mostly at nighttime to the right wrist.  Take naproxen twice daily for the next 2 to 3 days consistently, then as needed.  Take this medicine with food to avoid stomach upset. Do not take any other NSAID containing medications while taking naproxen like ibuprofen.  Your first dose of naproxen may be tomorrow morning since we gave you a dose of ibuprofen in clinic tonight.  Follow-up with the orthopedic provider listed on your paperwork as needed for any worsening symptoms.  Work note is at the end your packet.  If you develop any new or worsening symptoms or do not improve in the next 2 to 3 days, please return.  If your symptoms are severe, please go to the emergency room.  Follow-up with your primary care provider for further evaluation and management of your symptoms as well as ongoing wellness visits.  I hope you feel better!

## 2021-12-04 NOTE — ED Provider Notes (Signed)
[place]    CSN: [id number] Arrival date & time: 12/04/21  1641      History   Chief Complaint Chief Complaint  Patient presents with   Hand Pain    HPI Yvonne Mckee is a 56 y.o. female.   Patient presents urgent care for evaluation of right hand pain that started a few weeks ago but has worsened since last night. States she hit her hand a few months ago and believes this could be contributing to the worsening pain. 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 Pain is worse in the mornings and she reports slight numbness/tingling to the right index finger. Pain is mostly to the palmar aspect of the right hand and radiates from the distal right index finger to the base of the right palm. Denies wrist pain, history of carpal tunnel syndrome, recent injury, color changes, laceration, and bruising to the hand. She has not attempted use of any over the counter medications prior to arrival at urgent care for symptoms. Pain is currently a 7 on a scale of 0-10 and described as a burning/aching sensation. Patient requesting x-ray of hand today.   Hand Pain    Past Medical History:  Diagnosis Date   Anemia    Anxiety    Calculus of gallbladder with acute cholecystitis, without mention of obstruction 03/30/2012   Depression    Depression 03/30/2012   Diabetes mellitus without complication (HCC)    GERD (gastroesophageal reflux disease)    Hypertension    Menometrorrhagia    Sciatica    Sciatica 03/30/2012   Severe obesity (BMI >= 40) ([place]) 03/30/2012    Patient Active Problem List   Diagnosis Date Noted   Diabetes ([place]) 02/23/2018   Abdominal wall hematoma 01/08/2017   S/P TAH-BSO 12/22/2016   Calculus of gallbladder with acute cholecystitis, without mention of obstruction 03/30/2012   Depression 03/30/2012   Sciatica 03/30/2012   Severe  obesity (BMI >= 40) ([place]) 03/30/2012   BACK PAIN, LUMBAR 07/24/2008   OTITIS MEDIA, ACUTE 07/08/2008   CELLULITIS, FINGER 07/08/2008   OBESITY 06/17/2008   UPPER RESPIRATORY INFECTION, VIRAL 05/14/2008   Hypertension associated with diabetes ([place]) 06/07/2007   FLUID RETENTION 06/02/2006   ELEVATED BLOOD PRESSURE WITHOUT DIAGNOSIS OF HYPERTENSION 04/25/2006   ANXIETY 04/22/2006   DEPRESSION 04/22/2006   MIGRAINE HEADACHE 04/22/2006   GERD 04/22/2006    Past Surgical History:  Procedure Laterality Date   ABDOMINAL HYSTERECTOMY N/A 12/22/2016   Procedure: HYSTERECTOMY ABDOMINAL;  Surgeon: Florian Buff, MD;  Location: [facility];  Service: Gynecology;  Laterality: N/A;   CESAREAN SECTION     x 2   CHOLECYSTECTOMY N/A 03/29/2012   Procedure: LAPAROSCOPIC CHOLECYSTECTOMY WITH INTRAOPERATIVE CHOLANGIOGRAM;  Surgeon: Joyice Faster. Cornett, MD;  Location: [facility];  Service: General;  Laterality: N/A;   SALPINGOOPHORECTOMY Bilateral 12/22/2016   Procedure: BILATERAL SALPINGO OOPHORECTOMY;  Surgeon: Florian Buff, MD;  Location: [facility];  Service: Gynecology;  Laterality: Bilateral;   TONSILLECTOMY     TUBAL LIGATION      OB History     Gravida  2   Para  2   Term  2   Preterm      AB      Living  2      SAB      IAB  Ectopic      Multiple      Live Births  2            Home Medications    Prior to Admission medications   Medication Sig Start Date End Date Taking? Authorizing Provider  acetaminophen (TYLENOL) 500 MG tablet Take 1 tablet (500 mg total) by mouth every 6 (six) hours as needed. 07/07/19   Avegno, Darrelyn Hillock, FNP  aspirin 81 MG chewable tablet Chew 81 mg by mouth at bedtime.     [provider]  cyclobenzaprine (FEXMID) 7.5 MG tablet Take 1 tablet (7.5 mg total) by mouth 3 (three) times daily as needed for muscle spasms. 07/07/19   Avegno, Darrelyn Hillock, FNP  estradiol (ESTRACE) 0.1 MG/GM vaginal cream Use 1 grams every other night 01/18/20   Florian Buff, MD  estradiol (ESTRACE) 2 MG tablet Take 1 tablet by mouth once daily 07/21/21   Florian Buff, MD  FLUoxetine (PROZAC) 40 MG capsule Take 40 mg by mouth at bedtime.     [provider]  metFORMIN (GLUCOPHAGE-XR) 500 MG 24 hr tablet Take 500 mg by mouth 2 (two) times daily. 07/21/17   [provider]  naproxen (NAPROSYN) 500 MG tablet Take 1 tablet (500 mg total) by mouth 2 (two) times daily with a meal. 12/04/21   Talbot Grumbling, FNP  olmesartan-hydrochlorothiazide (BENICAR HCT) 40-25 MG tablet Take 1 tablet by mouth daily.    [provider]  omeprazole (PRILOSEC) 40 MG capsule Take 40 mg by mouth daily.    [provider]    Family History Family History  Problem Relation Age of Onset   Asthma Daughter    Diabetes Maternal Grandmother    Hypertension Maternal Grandmother    Congestive Heart Failure Maternal Grandmother    Emphysema Maternal Grandmother    Cancer Maternal Grandmother        lung   Other Father        heart problems   Hypertension Mother    Other Mother        hardening of heart   Hypertension Brother    Diabetes Brother    Other Son        born with lypomengeocele    Social History Social History   Tobacco Use   Smoking status: Never   Smokeless tobacco: Never  Vaping Use   Vaping Use: Never used  Substance Use Topics   Alcohol use: Yes    Comment: very seldom   Drug use: No     Allergies   Squid oil, St johns wort, and Bupropion hcl   Review of Systems Review of Systems Per HPI  Physical Exam Triage Vital Signs ED Triage Vitals  Enc Vitals Group     BP 12/04/21 1751 (!) 145/82     Pulse Rate 12/04/21 1751 82     Resp 12/04/21 1751 18     Temp 12/04/21 1751 98.2 F (36.8 C)     Temp Source 12/04/21 1751 Tympanic     SpO2 12/04/21 1751 94 %     Weight --      Height --      Head Circumference --      Peak Flow --      Pain Score 12/04/21 1753 7     Pain Loc --      Pain Edu? --       Excl. in [place]? --    No data found.  Updated Vital Signs BP (!) 145/82 (BP Location: Left Arm)   Pulse 82   Temp 98.2 F (36.8 C) (Tympanic)   Resp 18   LMP 10/23/2016 (Approximate) Comment: still bleeding since 10/23/2016.  SpO2 94%   Visual Acuity Right Eye Distance:   Left Eye Distance:   Bilateral Distance:    Right Eye Near:   Left Eye Near:    Bilateral Near:     Physical Exam Vitals and nursing note reviewed.  Constitutional:      Appearance: She is not ill-appearing or toxic-appearing.  HENT:     Head: Normocephalic and atraumatic.     Right Ear: Hearing and external ear normal.     Left Ear: Hearing and external ear normal.     Nose: Nose normal.     Mouth/Throat:     Lips: Pink.  Eyes:     General: Lids are normal. Vision grossly intact. Gaze aligned appropriately.     Extraocular Movements: Extraocular movements intact.     Conjunctiva/sclera: Conjunctivae normal.  Pulmonary:     Effort: Pulmonary effort is normal.  Musculoskeletal:     Right wrist: Normal. No snuff box tenderness or crepitus. Normal pulse.     Left wrist: Normal.     Right hand: Tenderness present. No swelling, deformity, lacerations or bony tenderness. Normal range of motion. Decreased strength of finger abduction. Normal sensation. There is no disruption of two-point discrimination. Normal capillary refill. Normal pulse.     Cervical back: Neck supple.     Comments: Right hand/wrist: Tenderness to palpation to the right palm. Positive right Phalens test. +2 radial pulses bilaterally. Slight decreased strength to the right hand, able to make a loose fist.  Skin:    General: Skin is warm and dry.     Capillary Refill: Capillary refill takes less than 2 seconds.     Findings: No rash.  Neurological:     General: No focal deficit present.     Mental Status: She is alert and oriented to person, place, and time. Mental status is at baseline.     Cranial Nerves: No dysarthria or facial  asymmetry.  Psychiatric:        Mood and Affect: Mood normal.        Speech: Speech normal.        Behavior: Behavior normal.        Thought Content: Thought content normal.        Judgment: Judgment normal.      UC Treatments / Results  Labs (all labs ordered are listed, but only abnormal results are displayed) Labs Reviewed - No data to display  EKG   Radiology DG Hand Complete Right  Result Date: 12/04/2021 CLINICAL DATA:  Right hand pain. EXAM: RIGHT HAND - COMPLETE 3+ VIEW COMPARISON:  Right hand radiograph dated 09/24/2011. FINDINGS: No acute fracture or dislocation. No significant arthritic changes. The soft tissues are unremarkable. IMPRESSION: Negative. Electronically Signed   By: Anner Crete M.D.   On: 12/04/2021 18:52    Procedures Procedures (including critical care time)  Medications Ordered in UC Medications  ibuprofen (ADVIL) tablet 800 mg (800 mg Oral Given 12/04/21 1945)    Initial Impression / Assessment and Plan / UC Course  I have reviewed the triage vital signs and the nursing notes.  Pertinent labs & imaging results that were available during my care of the patient were reviewed by me and considered in my medical decision making (see chart for details).  1. 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  Wrist splint applied to the right wrist to be worn at nighttime to provide stability and prevent median nerve compression causing numbness and tingling to the right palm/right index finger.  Musculoskeletal exam is stable and without evidence of traumatic injury to the right hand, although patient is requesting imaging today so we performed x-ray of the right hand which was negative.  Naproxen twice daily for the next 2 to 3 days, then as needed to reduce inflammation and pain.  Advised to take this medicine with  food to avoid stomach upset.  She may start naproxen tomorrow morning as she was given 100 mg of ibuprofen in the clinic today.  No other NSAIDs while taking naproxen.  Walking referral to orthopedics given.  May also apply ice to the area to reduce inflammation pain.  Work note at the end of the packet.  Discussed physical exam and available lab work findings in clinic with patient.  Counseled patient regarding appropriate use of medications and potential side effects for all medications recommended or prescribed today. Discussed red flag signs and symptoms of worsening condition,when to call the PCP office, return to urgent care, and when to seek higher level of care in the emergency department. Patient verbalizes understanding and agreement with plan. All questions answered. Patient discharged in stable condition.    Final Clinical Impressions(s) / UC Diagnoses   Final diagnoses:  Right hand pain  Carpal tunnel syndrome of right wrist     Discharge Instructions      Your right hand pain is likely related to median nerve compression and carpal tunnel syndrome.  Wear the wrist brace at nighttime and during the daytime if needed, but mostly at nighttime to the right wrist.  Take naproxen twice daily for the next 2 to 3 days consistently, then as needed.  Take this medicine with food to avoid stomach upset. Do not take any other NSAID containing medications while taking naproxen like ibuprofen.  Your first dose of naproxen may be tomorrow morning since we gave you a dose of ibuprofen in clinic tonight.  Follow-up with the orthopedic provider listed on your paperwork as needed for any worsening symptoms.  Work note is at the end your packet.  If you develop any new or worsening symptoms or do not improve in the next 2 to 3 days, please return.  If your symptoms are severe, please go to the emergency room.  Follow-up with your primary care provider for further evaluation and management of  your symptoms as well as ongoing wellness visits.  I hope you feel better!      ED Prescriptions     Medication Sig Dispense Auth. Provider   naproxen (NAPROSYN) 500 MG tablet Take 1 tablet (500 mg total) by mouth 2 (two) times daily with a meal. 30 tablet Lakita Sahlin, Stasia Cavalier, FNP      PDMP not reviewed this encounter.   Talbot Grumbling, [place] 12/06/21 1027

## 2023-03-04 ENCOUNTER — Ambulatory Visit
Admission: EM | Admit: 2023-03-04 | Discharge: 2023-03-04 | Disposition: A | Discharge status: Home / Self Care | Payer: No Typology Code available for payment source

## 2023-03-04 DIAGNOSIS — H6592 Unspecified nonsuppurative otitis media, left ear: Secondary | ICD-10-CM | POA: Diagnosis not present

## 2023-03-04 DIAGNOSIS — J069 Acute upper respiratory infection, unspecified: Secondary | ICD-10-CM

## 2023-03-04 LAB — POCT RAPID STREP A (OFFICE): Rapid Strep A Screen: NEGATIVE

## 2023-03-04 MED ORDER — FLUTICASONE PROPIONATE 50 MCG/ACT NA SUSP: 2.0000 | Freq: Every day | NASAL | 0 refills | Status: AC

## 2023-03-04 MED ORDER — PROMETHAZINE-DM 6.25-15 MG/5ML PO SYRP: 5.0000 mL | ORAL_SOLUTION | Freq: Four times a day (QID) | ORAL | 0 refills | Status: DC | PRN

## 2023-03-04 MED ORDER — AMOXICILLIN-POT CLAVULANATE 875-125 MG PO TABS: 1.0000 | ORAL_TABLET | Freq: Two times a day (BID) | ORAL | 0 refills | Status: AC

## 2023-03-04 NOTE — ED Provider Notes (Signed)
[facility]    CSN: [id number] Arrival date & time: 03/04/23  1359      History   Chief Complaint No chief complaint on file.   HPI Yvonne Mckee is a 58 y.o. female.   The history is provided by the patient.   Patient presents with a 5-day history of fatigue, nasal congestion, runny nose, bilateral ear pain, sore throat, and productive cough.  Denies headache, ear drainage, wheezing, difficulty breathing, chest pain, abdominal pain, nausea, vomiting, diarrhea, or rash.  Patient reports that she has been taking NyQuil For her symptoms.  States that she took a COVID test at work which was negative.  Past Medical History:  Diagnosis Date   Anemia    Anxiety    Calculus of gallbladder with acute cholecystitis, without mention of obstruction 03/30/2012   Depression    Depression 03/30/2012   Diabetes mellitus without complication (HCC)    GERD (gastroesophageal reflux disease)    Hypertension    Menometrorrhagia    Sciatica    Sciatica 03/30/2012   Severe obesity (BMI >= 40) (HCC) 03/30/2012    Patient Active Problem List   Diagnosis Date Noted   Diabetes (HCC) 02/23/2018   Abdominal wall hematoma 01/08/2017   S/P TAH-BSO 12/22/2016   Calculus of gallbladder with acute cholecystitis 03/30/2012   Depression 03/30/2012   Sciatica 03/30/2012   Severe obesity (BMI >= 40) (HCC) 03/30/2012   BACK PAIN, LUMBAR 07/24/2008   OTITIS MEDIA, ACUTE 07/08/2008   CELLULITIS, FINGER 07/08/2008   OBESITY 06/17/2008   UPPER RESPIRATORY INFECTION, VIRAL 05/14/2008   Hypertension associated with diabetes (HCC) 06/07/2007   FLUID RETENTION 06/02/2006   ELEVATED BLOOD PRESSURE WITHOUT DIAGNOSIS OF HYPERTENSION 04/25/2006   ANXIETY 04/22/2006   DEPRESSION 04/22/2006   MIGRAINE HEADACHE 04/22/2006   GERD 04/22/2006    Past Surgical History:  Procedure Laterality Date   ABDOMINAL HYSTERECTOMY N/A 12/22/2016   Procedure: HYSTERECTOMY ABDOMINAL;  Surgeon: Lazaro Arms,  MD;  Location: [facility];  Service: Gynecology;  Laterality: N/A;   CESAREAN SECTION     x 2   CHOLECYSTECTOMY N/A 03/29/2012   Procedure: LAPAROSCOPIC CHOLECYSTECTOMY WITH INTRAOPERATIVE CHOLANGIOGRAM;  Surgeon: Clovis Pu. Cornett, MD;  Location: [facility];  Service: General;  Laterality: N/A;   SALPINGOOPHORECTOMY Bilateral 12/22/2016   Procedure: BILATERAL SALPINGO OOPHORECTOMY;  Surgeon: Lazaro Arms, MD;  Location: [facility];  Service: Gynecology;  Laterality: Bilateral;   TONSILLECTOMY     TUBAL LIGATION      OB History     Gravida  2   Para  2   Term  2   Preterm      AB      Living  2      SAB      IAB      Ectopic      Multiple      Live Births  2            Home Medications    Prior to Admission medications   Medication Sig Start Date End Date Taking? Authorizing Provider  amoxicillin-clavulanate (AUGMENTIN) 875-125 MG tablet Take 1 tablet by mouth every 12 (twelve) hours. 03/04/23  Yes Leath-Warren, Sadie Haber, NP  fluticasone (FLONASE) 50 MCG/ACT nasal spray Place 2 sprays into both nostrils daily. 03/04/23  Yes Leath-Warren, Sadie Haber, NP  promethazine-dextromethorphan (PROMETHAZINE-DM) 6.25-15 MG/5ML syrup Take 5 mLs by mouth 4 (four) times daily as needed. 03/04/23  Yes Leath-Warren, Sadie Haber, NP  acetaminophen (TYLENOL)  500 MG tablet Take 1 tablet (500 mg total) by mouth every 6 (six) hours as needed. 07/07/19   Avegno, Zachery Dakins, FNP  aspirin 81 MG chewable tablet Chew 81 mg by mouth at bedtime.     [provider]  cyclobenzaprine (FEXMID) 7.5 MG tablet Take 1 tablet (7.5 mg total) by mouth 3 (three) times daily as needed for muscle spasms. 07/07/19   Avegno, Zachery Dakins, FNP  estradiol (ESTRACE) 0.1 MG/GM vaginal cream Use 1 grams every other night 01/18/20   Lazaro Arms, MD  estradiol (ESTRACE) 2 MG tablet Take 1 tablet by mouth once daily 07/21/21   Lazaro Arms, MD  FLUoxetine (PROZAC) 40 MG capsule Take 40 mg by mouth at bedtime.      [provider]  metFORMIN (GLUCOPHAGE-XR) 500 MG 24 hr tablet Take 500 mg by mouth 2 (two) times daily. 07/21/17   [provider]  naproxen (NAPROSYN) 500 MG tablet Take 1 tablet (500 mg total) by mouth 2 (two) times daily with a meal. 12/04/21   Carlisle Beers, FNP  olmesartan-hydrochlorothiazide (BENICAR HCT) 40-25 MG tablet Take 1 tablet by mouth daily.    [provider]  omeprazole (PRILOSEC) 40 MG capsule Take 40 mg by mouth daily.    [provider]  TRULICITY 3 MG/0.5ML SOAJ SMARTSIG:3 Milligram(s) SUB-Q Once a Week    [provider]    Family History Family History  Problem Relation Age of Onset   Asthma Daughter    Diabetes Maternal Grandmother    Hypertension Maternal Grandmother    Congestive Heart Failure Maternal Grandmother    Emphysema Maternal Grandmother    Cancer Maternal Grandmother        lung   Other Father        heart problems   Hypertension Mother    Other Mother        hardening of heart   Hypertension Brother    Diabetes Brother    Other Son        born with lypomengeocele    Social History Social History   Tobacco Use   Smoking status: Never   Smokeless tobacco: Never  Vaping Use   Vaping status: Never Used  Substance Use Topics   Alcohol use: Yes    Comment: very seldom   Drug use: No     Allergies   Squid oil, St johns wort, and Bupropion hcl   Review of Systems Review of Systems Per HPI  Physical Exam Triage Vital Signs ED Triage Vitals  Encounter Vitals Group     BP 03/04/23 1412 (!) 169/77     Systolic BP Percentile --      Diastolic BP Percentile --      Pulse Rate 03/04/23 1412 82     Resp 03/04/23 1412 20     Temp 03/04/23 1412 98.3 F (36.8 C)     Temp Source 03/04/23 1412 Oral     SpO2 03/04/23 1412 97 %     Weight --      Height --      Head Circumference --      Peak Flow --      Pain Score 03/04/23 1411 7     Pain Loc --      Pain Education --       Exclude from Growth Chart --    No data found.  Updated Vital Signs BP (!) 169/77 (BP Location: Right Arm)   Pulse 82  Temp 98.3 F (36.8 C) (Oral)   Resp 20   LMP 10/23/2016 (Approximate) Comment: still bleeding since 10/23/2016.  SpO2 97%   Visual Acuity Right Eye Distance:   Left Eye Distance:   Bilateral Distance:    Right Eye Near:   Left Eye Near:    Bilateral Near:     Physical Exam Vitals and nursing note reviewed.  Constitutional:      General: She is not in acute distress.    Appearance: Normal appearance.  HENT:     Head: Normocephalic.     Right Ear: Ear canal and external ear normal. A middle ear effusion is present.     Left Ear: External ear normal. Tympanic membrane is erythematous and bulging.     Nose: Congestion present.     Right Turbinates: Enlarged and swollen.     Left Turbinates: Enlarged and swollen.     Right Sinus: No maxillary sinus tenderness or frontal sinus tenderness.     Left Sinus: No maxillary sinus tenderness or frontal sinus tenderness.     Mouth/Throat:     Mouth: Mucous membranes are moist.     Pharynx: Posterior oropharyngeal erythema present.     Comments: Cobblestoning present to posterior oropharynx  Eyes:     Extraocular Movements: Extraocular movements intact.     Conjunctiva/sclera: Conjunctivae normal.     Pupils: Pupils are equal, round, and reactive to light.  Cardiovascular:     Rate and Rhythm: Normal rate and regular rhythm.     Pulses: Normal pulses.     Heart sounds: Normal heart sounds.  Pulmonary:     Effort: Pulmonary effort is normal. No respiratory distress.     Breath sounds: Normal breath sounds. No stridor. No wheezing, rhonchi or rales.  Abdominal:     General: Bowel sounds are normal.     Palpations: Abdomen is soft.     Tenderness: There is no abdominal tenderness.  Musculoskeletal:     Cervical back: Normal range of motion.  Lymphadenopathy:     Cervical: No cervical adenopathy.  Skin:     General: Skin is warm and dry.  Neurological:     General: No focal deficit present.     Mental Status: She is alert and oriented to person, place, and time.  Psychiatric:        Mood and Affect: Mood normal.        Behavior: Behavior normal.      UC Treatments / Results  Labs (all labs ordered are listed, but only abnormal results are displayed) Labs Reviewed  POCT RAPID STREP A (OFFICE)    EKG   Radiology No results found.  Procedures Procedures (including critical care time)  Medications Ordered in UC Medications - No data to display  Initial Impression / Assessment and Plan / UC Course  I have reviewed the triage vital signs and the nursing notes.  Pertinent labs & imaging results that were available during my care of the patient were reviewed by me and considered in my medical decision making (see chart for details).  On exam, lung sounds are clear throughout, room air sats at 97%.  The rapid strep test was negative.  Culture not indicated as patient is being treated for otitis media which would also cover any possible strep infection.  Patient does have bulging and erythema of the left tympanic membrane, consistent with otitis media.  Do suspect patient also has underlying viral URI with cough.  Will treat otitis media  with Augmentin 875/125 mg tablets twice daily for the next 7 days, for the cough, Promethazine DM was prescribed, and fluticasone 50 micro nasal spray for nasal congestion.  Supportive care recommendations were provided and discussed with the patient to include fluids, rest, over-the-counter analgesics, warm salt water gargles, and normal saline nasal spray.  Also advised patient to sleep elevated to help with cough.  Discussed indications with patient regarding follow-up.  Patient was in agreement with this plan of care and verbalized understanding.  All questions were answered.  Patient stable for discharge.  Work note was provided.  Final Clinical  Impressions(s) / UC Diagnoses   Final diagnoses:  Left otitis media with effusion  Viral URI with cough     Discharge Instructions      Take medication as prescribed. May take Tylenol or Ibuprofen for pain, fever, or general discomfort. Warm compresses to the affected ear help with comfort. May use normal saline nasal spray for nasal congestion and runny nose. Recommend using a humidifier at nighttime during sleep and sleep elevated on pillows while symptoms persist. Do not stick anything inside the ear while symptoms persist. Avoid getting water inside of the ear while symptoms persist. If symptoms fail to improve with this treatment, you may follow-up in this clinic or with your PCP for further evaluation.      ED Prescriptions     Medication Sig Dispense Auth. Provider   amoxicillin-clavulanate (AUGMENTIN) 875-125 MG tablet Take 1 tablet by mouth every 12 (twelve) hours. 14 tablet Leath-Warren, Sadie Haber, NP   fluticasone (FLONASE) 50 MCG/ACT nasal spray Place 2 sprays into both nostrils daily. 16 g Leath-Warren, Sadie Haber, NP   promethazine-dextromethorphan (PROMETHAZINE-DM) 6.25-15 MG/5ML syrup Take 5 mLs by mouth 4 (four) times daily as needed. 118 mL Leath-Warren, Sadie Haber, NP      PDMP not reviewed this encounter.   Abran Cantor, NP 03/04/23 1506

## 2023-03-04 NOTE — Discharge Instructions (Signed)
Take medication as prescribed. May take Tylenol or Ibuprofen for pain, fever, or general discomfort. Warm compresses to the affected ear help with comfort. May use normal saline nasal spray for nasal congestion and runny nose. Recommend using a humidifier at nighttime during sleep and sleep elevated on pillows while symptoms persist. Do not stick anything inside the ear while symptoms persist. Avoid getting water inside of the ear while symptoms persist. If symptoms fail to improve with this treatment, you may follow-up in this clinic or with your PCP for further evaluation.

## 2023-03-04 NOTE — ED Triage Notes (Signed)
Pt reports she has a cough, throat pain,mucus, ear pain,and fatigue x 1 week.   Took nyquil caps

## 2023-09-17 ENCOUNTER — Ambulatory Visit
Admission: EM | Admit: 2023-09-17 | Discharge: 2023-09-17 | Disposition: A | Discharge status: Home / Self Care | Attending: Nurse Practitioner | Admitting: Nurse Practitioner

## 2023-09-17 DIAGNOSIS — U071 COVID-19: Secondary | ICD-10-CM | POA: Diagnosis not present

## 2023-09-17 DIAGNOSIS — R0989 Other specified symptoms and signs involving the circulatory and respiratory systems: Secondary | ICD-10-CM | POA: Diagnosis not present

## 2023-09-17 LAB — POCT RAPID STREP A (OFFICE): Rapid Strep A Screen: NEGATIVE

## 2023-09-17 LAB — POC SOFIA SARS ANTIGEN FIA: SARS Coronavirus 2 Ag: POSITIVE — AB

## 2023-09-17 MED ORDER — PROMETHAZINE-DM 6.25-15 MG/5ML PO SYRP: 5.0000 mL | ORAL_SOLUTION | Freq: Four times a day (QID) | ORAL | 0 refills | Status: AC | PRN

## 2023-09-17 MED ORDER — LIDOCAINE VISCOUS HCL 2 % MT SOLN: OROMUCOSAL | 0 refills | Status: AC

## 2023-09-17 NOTE — Discharge Instructions (Signed)
 The rapid strep test was negative, COVID test was positive. Take medication as prescribed. Increase fluids and allow for plenty of rest. Continue over-the-counter Tylenol  or ibuprofen  as needed for pain, fever, or general discomfort. You may use normal saline nasal spray throughout the day for nasal congestion and runny nose. For your throat pain, recommend warm salt water gargles 3-4 times daily.  You may also use over-the-counter Chloraseptic throat spray or throat lozenges while symptoms persist. Recommend use of a humidifier in your bedroom at nighttime during sleep and sleeping elevated on pillows while symptoms persist. If you develop fever, you should remain home until you have been fever free for 24 hours with no medication. Symptoms should improve over the next 5 to 7 days.  If symptoms fail to improve, or appear to be worsening, you may follow-up in this clinic or with your primary care physician for further evaluation. Follow-up as needed.

## 2023-09-17 NOTE — ED Triage Notes (Signed)
 Pt reports she has a sore throat, coughing, and nasal congestion, ears hurt, fatigue x 1 week     Took ibuprofen  and tylenol , and nyquil

## 2023-09-17 NOTE — ED Provider Notes (Signed)
 [facility]    CSN: [id number] Arrival date & time: 09/17/23  9046      History   Chief Complaint Chief Complaint  Patient presents with   Sore Throat    HPI Yvonne Mckee is a 58 y.o. female.   The history is provided by the patient.   Patient presents with a 1 week history of sore throat, cough, nasal congestion, fatigue, and bilateral ear pain.  Patient reports fever is high today of 99.8, states that her temperature normally runs low.  Further denies chills, headache, ear drainage, shortness of breath, difficulty breathing, chest pain, abdominal pain, nausea, vomiting, diarrhea, or rash.  Reports she has been taking NyQuil, ibuprofen  and Tylenol  with minimal relief.  Past Medical History:  Diagnosis Date   Anemia    Anxiety    Calculus of gallbladder with acute cholecystitis, without mention of obstruction 03/30/2012   Depression    Depression 03/30/2012   Diabetes mellitus without complication (HCC)    GERD (gastroesophageal reflux disease)    Hypertension    Menometrorrhagia    Sciatica    Sciatica 03/30/2012   Severe obesity (BMI >= 40) (HCC) 03/30/2012    Patient Active Problem List   Diagnosis Date Noted   Diabetes (HCC) 02/23/2018   Abdominal wall hematoma 01/08/2017   S/P TAH-BSO 12/22/2016   Calculus of gallbladder with acute cholecystitis 03/30/2012   Depression 03/30/2012   Sciatica 03/30/2012   Severe obesity (BMI >= 40) (HCC) 03/30/2012   BACK PAIN, LUMBAR 07/24/2008   OTITIS MEDIA, ACUTE 07/08/2008   CELLULITIS, FINGER 07/08/2008   OBESITY 06/17/2008   UPPER RESPIRATORY INFECTION, VIRAL 05/14/2008   Hypertension associated with diabetes (HCC) 06/07/2007   FLUID RETENTION 06/02/2006   ELEVATED BLOOD PRESSURE WITHOUT DIAGNOSIS OF HYPERTENSION 04/25/2006   ANXIETY 04/22/2006   DEPRESSION 04/22/2006   MIGRAINE HEADACHE 04/22/2006   GERD 04/22/2006    Past Surgical History:  Procedure Laterality Date   ABDOMINAL HYSTERECTOMY N/A  12/22/2016   Procedure: HYSTERECTOMY ABDOMINAL;  Surgeon: Jayne Vonn DEL, MD;  Location: [facility];  Service: Gynecology;  Laterality: N/A;   CESAREAN SECTION     x 2   CHOLECYSTECTOMY N/A 03/29/2012   Procedure: LAPAROSCOPIC CHOLECYSTECTOMY WITH INTRAOPERATIVE CHOLANGIOGRAM;  Surgeon: Debby LABOR. Cornett, MD;  Location: [facility];  Service: General;  Laterality: N/A;   SALPINGOOPHORECTOMY Bilateral 12/22/2016   Procedure: BILATERAL SALPINGO OOPHORECTOMY;  Surgeon: Jayne Vonn DEL, MD;  Location: [facility];  Service: Gynecology;  Laterality: Bilateral;   TONSILLECTOMY     TUBAL LIGATION      OB History     Gravida  2   Para  2   Term  2   Preterm      AB      Living  2      SAB      IAB      Ectopic      Multiple      Live Births  2            Home Medications    Prior to Admission medications   Medication Sig Start Date End Date Taking? Authorizing Provider  lidocaine  (XYLOCAINE ) 2 % solution Gargle and spit 5 mL every 6 hours as needed for throat pain or discomfort. 09/17/23  Yes Leath-Warren, Etta PARAS, NP  promethazine -dextromethorphan (PROMETHAZINE -DM) 6.25-15 MG/5ML syrup Take 5 mLs by mouth 4 (four) times daily as needed. 09/17/23  Yes Leath-Warren, Etta PARAS, NP  acetaminophen  (TYLENOL ) 500 MG tablet  Take 1 tablet (500 mg total) by mouth every 6 (six) hours as needed. 07/07/19   Avegno, Komlanvi S, FNP  amoxicillin -clavulanate (AUGMENTIN ) 875-125 MG tablet Take 1 tablet by mouth every 12 (twelve) hours. 03/04/23   Leath-Warren, Etta PARAS, NP  aspirin  81 MG chewable tablet Chew 81 mg by mouth at bedtime.     [provider]  cyclobenzaprine  (FEXMID ) 7.5 MG tablet Take 1 tablet (7.5 mg total) by mouth 3 (three) times daily as needed for muscle spasms. 07/07/19   Avegno, Komlanvi S, FNP  estradiol  (ESTRACE ) 0.1 MG/GM vaginal cream Use 1 grams every other night 01/18/20   Jayne Vonn DEL, MD  estradiol  (ESTRACE ) 2 MG tablet Take 1 tablet by mouth once daily 07/21/21    Jayne Vonn DEL, MD  FLUoxetine  (PROZAC ) 40 MG capsule Take 40 mg by mouth at bedtime.     [provider]  fluticasone  (FLONASE ) 50 MCG/ACT nasal spray Place 2 sprays into both nostrils daily. 03/04/23   Leath-Warren, Etta PARAS, NP  metFORMIN  (GLUCOPHAGE -XR) 500 MG 24 hr tablet Take 500 mg by mouth 2 (two) times daily. 07/21/17   [provider]  naproxen  (NAPROSYN ) 500 MG tablet Take 1 tablet (500 mg total) by mouth 2 (two) times daily with a meal. 12/04/21   Enedelia Dorna HERO, FNP  olmesartan-hydrochlorothiazide  (BENICAR HCT) 40-25 MG tablet Take 1 tablet by mouth daily.    [provider]  omeprazole (PRILOSEC) 40 MG capsule Take 40 mg by mouth daily.    [provider]  TRULICITY 3 MG/0.5ML SOAJ SMARTSIG:3 Milligram(s) SUB-Q Once a Week    [provider]    Family History Family History  Problem Relation Age of Onset   Asthma Daughter    Diabetes Maternal Grandmother    Hypertension Maternal Grandmother    Congestive Heart Failure Maternal Grandmother    Emphysema Maternal Grandmother    Cancer Maternal Grandmother        lung   Other Father        heart problems   Hypertension Mother    Other Mother        hardening of heart   Hypertension Brother    Diabetes Brother    Other Son        born with lypomengeocele    Social History Social History   Tobacco Use   Smoking status: Never   Smokeless tobacco: Never  Vaping Use   Vaping status: Never Used  Substance Use Topics   Alcohol use: Yes    Comment: very seldom   Drug use: No     Allergies   Squid oil, St johns wort, and Bupropion hcl   Review of Systems Review of Systems Per HPI  Physical Exam Triage Vital Signs ED Triage Vitals  Encounter Vitals Group     BP 09/17/23 1016 134/79     Girls Systolic BP Percentile --      Girls Diastolic BP Percentile --      Boys Systolic BP Percentile --      Boys Diastolic BP Percentile --      Pulse Rate 09/17/23  1016 87     Resp 09/17/23 1016 20     Temp 09/17/23 1016 99.8 F (37.7 C)     Temp Source 09/17/23 1016 Oral     SpO2 09/17/23 1016 93 %     Weight --      Height --      Head Circumference --  Peak Flow --      Pain Score 09/17/23 1017 8     Pain Loc --      Pain Education --      Exclude from Growth Chart --    No data found.  Updated Vital Signs BP 134/79 (BP Location: Right Arm)   Pulse 87   Temp 99.8 F (37.7 C) (Oral)   Resp 20   LMP 10/23/2016 (Approximate) Comment: still bleeding since 10/23/2016.  SpO2 93%   Visual Acuity Right Eye Distance:   Left Eye Distance:   Bilateral Distance:    Right Eye Near:   Left Eye Near:    Bilateral Near:     Physical Exam Vitals and nursing note reviewed.  Constitutional:      General: She is not in acute distress.    Appearance: Normal appearance.  HENT:     Head: Normocephalic.     Right Ear: Tympanic membrane, ear canal and external ear normal.     Left Ear: Tympanic membrane, ear canal and external ear normal.     Nose: Congestion present.     Right Turbinates: Enlarged and swollen.     Left Turbinates: Enlarged and swollen.     Right Sinus: No maxillary sinus tenderness or frontal sinus tenderness.     Left Sinus: No maxillary sinus tenderness or frontal sinus tenderness.     Mouth/Throat:     Lips: Pink.     Mouth: Mucous membranes are moist.     Pharynx: Uvula midline. Posterior oropharyngeal erythema and postnasal drip present. No pharyngeal swelling, oropharyngeal exudate or uvula swelling.     Comments: Cobblestoning present to posterior oropharynx  Eyes:     Extraocular Movements: Extraocular movements intact.     Conjunctiva/sclera: Conjunctivae normal.     Pupils: Pupils are equal, round, and reactive to light.  Cardiovascular:     Rate and Rhythm: Normal rate and regular rhythm.     Pulses: Normal pulses.     Heart sounds: Normal heart sounds.  Pulmonary:     Effort: Pulmonary effort is normal.  No respiratory distress.     Breath sounds: Normal breath sounds. No stridor. No wheezing, rhonchi or rales.  Abdominal:     General: Bowel sounds are normal.     Palpations: Abdomen is soft.     Tenderness: There is no abdominal tenderness.  Musculoskeletal:     Cervical back: Normal range of motion.  Skin:    General: Skin is warm and dry.  Neurological:     General: No focal deficit present.     Mental Status: She is alert and oriented to person, place, and time.  Psychiatric:        Mood and Affect: Mood normal.        Behavior: Behavior normal.      UC Treatments / Results  Labs (all labs ordered are listed, but only abnormal results are displayed) Labs Reviewed  POC SOFIA SARS ANTIGEN FIA - Abnormal; Notable for the following components:      Result Value   SARS Coronavirus 2 Ag Positive (*)    All other components within normal limits  POCT RAPID STREP A (OFFICE)    EKG   Radiology No results found.  Procedures Procedures (including critical care time)  Medications Ordered in UC Medications - No data to display  Initial Impression / Assessment and Plan / UC Course  I have reviewed the triage vital signs and the nursing notes.  Pertinent labs & imaging results that were available during my care of the patient were reviewed by me and considered in my medical decision making (see chart for details).  COVID test was positive, rapid strep test was negative.  Symptoms have been present for greater than 5 days, unable to start antiviral at this time.  Will provide symptomatic treatment with Promethazine  DM for the cough.  Patient states that she has Flonase  at home that she will begin using.  Also will prescribe viscous lidocaine  2% for patient to gargle and spit for throat pain or discomfort.  Supportive care recommendations were provided discussed with the patient to include fluids, rest, continuing over-the-counter analgesics, and use of normal saline nasal spray  throughout the day.  Discussed indications with patient regarding follow-up.  Patient was in agreement with this plan of care and verbalizes understanding.  All questions were answered.  Patient stable for discharge.   Final Clinical Impressions(s) / UC Diagnoses   Final diagnoses:  Symptoms of upper respiratory infection (URI)  COVID     Discharge Instructions      The rapid strep test was negative, COVID test was positive. Take medication as prescribed. Increase fluids and allow for plenty of rest. Continue over-the-counter Tylenol  or ibuprofen  as needed for pain, fever, or general discomfort. You may use normal saline nasal spray throughout the day for nasal congestion and runny nose. For your throat pain, recommend warm salt water gargles 3-4 times daily.  You may also use over-the-counter Chloraseptic throat spray or throat lozenges while symptoms persist. Recommend use of a humidifier in your bedroom at nighttime during sleep and sleeping elevated on pillows while symptoms persist. If you develop fever, you should remain home until you have been fever free for 24 hours with no medication. Symptoms should improve over the next 5 to 7 days.  If symptoms fail to improve, or appear to be worsening, you may follow-up in this clinic or with your primary care physician for further evaluation. Follow-up as needed.     ED Prescriptions     Medication Sig Dispense Auth. Provider   lidocaine  (XYLOCAINE ) 2 % solution Gargle and spit 5 mL every 6 hours as needed for throat pain or discomfort. 100 mL Leath-Warren, Etta PARAS, NP   promethazine -dextromethorphan (PROMETHAZINE -DM) 6.25-15 MG/5ML syrup Take 5 mLs by mouth 4 (four) times daily as needed. 118 mL Leath-Warren, Etta PARAS, NP      PDMP not reviewed this encounter.   Gilmer Etta PARAS, NP 09/17/23 1059
